# Patient Record
Sex: Male | Born: 1970 | Race: White | Hispanic: No | Smoking: Current every day smoker
Health system: Southern US, Community
[De-identification: ages and names within clinical notes are randomized; demographics above are authoritative.]

## PROBLEM LIST (undated history)

## (undated) DIAGNOSIS — J45909 Unspecified asthma, uncomplicated: Secondary | ICD-10-CM

## (undated) DIAGNOSIS — R7303 Prediabetes: Secondary | ICD-10-CM

## (undated) HISTORY — DX: Prediabetes: R73.03

## (undated) HISTORY — PX: NO PAST SURGERIES: SHX2092

---

## 2015-02-27 ENCOUNTER — Emergency Department (HOSPITAL_BASED_OUTPATIENT_CLINIC_OR_DEPARTMENT_OTHER)
Admission: EM | Admit: 2015-02-27 | Discharge: 2015-02-28 | Disposition: A | Discharge status: Home / Self Care | Payer: Self-pay | Attending: Emergency Medicine | Admitting: Emergency Medicine

## 2015-02-27 ENCOUNTER — Emergency Department (HOSPITAL_BASED_OUTPATIENT_CLINIC_OR_DEPARTMENT_OTHER): Payer: Self-pay

## 2015-02-27 ENCOUNTER — Encounter (HOSPITAL_BASED_OUTPATIENT_CLINIC_OR_DEPARTMENT_OTHER): Payer: Self-pay | Admitting: *Deleted

## 2015-02-27 DIAGNOSIS — Y998 Other external cause status: Secondary | ICD-10-CM | POA: Insufficient documentation

## 2015-02-27 DIAGNOSIS — Y9389 Activity, other specified: Secondary | ICD-10-CM | POA: Insufficient documentation

## 2015-02-27 DIAGNOSIS — W208XXA Other cause of strike by thrown, projected or falling object, initial encounter: Secondary | ICD-10-CM | POA: Insufficient documentation

## 2015-02-27 DIAGNOSIS — S298XXA Other specified injuries of thorax, initial encounter: Secondary | ICD-10-CM

## 2015-02-27 DIAGNOSIS — Y92009 Unspecified place in unspecified non-institutional (private) residence as the place of occurrence of the external cause: Secondary | ICD-10-CM | POA: Insufficient documentation

## 2015-02-27 DIAGNOSIS — F17210 Nicotine dependence, cigarettes, uncomplicated: Secondary | ICD-10-CM | POA: Insufficient documentation

## 2015-02-27 DIAGNOSIS — S29001A Unspecified injury of muscle and tendon of front wall of thorax, initial encounter: Secondary | ICD-10-CM | POA: Insufficient documentation

## 2015-02-27 NOTE — ED Notes (Signed)
Chest pain x 3 days. Worse when he takes a deep breath. He was working under a house 3 days ago and a board fell across his left upper chest.

## 2015-02-27 NOTE — ED Provider Notes (Signed)
CSN: [id number]     Arrival date & time 02/27/15  2215 History  By signing my name below, I, Soijett Blue, attest that this documentation has been prepared under the direction and in the presence of Paula Libra, MD. Electronically Signed: Soijett Blue, ED Scribe. 02/27/2015. 12:01 AM.   Chief Complaint  Patient presents with  . Chest Injury    The history is provided by the patient. No language interpreter was used.    HPI Comments: Creig Landin is a 44 y.o. male who presents to the Emergency Department complaining of chest injury occurring 3 days ago. He notes that he was working under a house when a floor joist fell on his right shoulder causing a pop sensation to his left sided chest. He notes that since then he has had left upper chest wall tenderness that is worsened with movement and deep breathing. Pain is moderate to severe at its worst. He has not tried any medications for the relief for his symptoms. He denies SOB but finds it difficult to take a deep breath due to pain.   History reviewed. No pertinent past medical history. History reviewed. No pertinent past surgical history. No family history on file. Social History  Substance Use Topics  . Smoking status: Current Every Day Smoker -- 1.50 packs/day    Types: Cigarettes  . Smokeless tobacco: None  . Alcohol Use: No    Review of Systems  A complete 10 system review of systems was obtained and all systems are negative except as noted in the HPI and PMH.    Allergies  Review of patient's allergies indicates no known allergies.  Home Medications   Prior to Admission medications   Medication Sig Start Date End Date Taking? Authorizing Provider  HYDROcodone-acetaminophen (NORCO/VICODIN) 5-325 MG tablet Take 1-2 tablets by mouth every 6 (six) hours as needed (for rib pain). 02/28/15   Xerxes Agrusa, MD   BP 146/94 mmHg  Pulse 73  Temp(Src) 98 F (36.7 C) (Oral)  Resp 20  Ht  (1.854 m)  Wt 240 lb (108.863 kg)   BMI 31.67 kg/m2  SpO2 100%   Physical Exam General: Well-developed, well-nourished male in no acute distress; appearance consistent with age of record HENT: normocephalic; atraumatic Eyes: pupils equal, round and reactive to light; extraocular muscles intact Neck: supple Heart: regular rate and rhythm Lungs: clear to auscultation bilaterally Chest: Left upper chest wall tenderness without deformity or crepitus, pain reproduced with passive range of motion of the left shoulder Abdomen: soft; nondistended; nontender; bowel sounds present Extremities: No deformity; full range of motion Neurologic: Awake, alert and oriented; motor function intact in all extremities and symmetric; no facial droop Skin: Warm and dry Psychiatric: Normal mood and affect    ED Course  Procedures (including critical care time) DIAGNOSTIC STUDIES: Oxygen Saturation is 100% on RA, nl by my interpretation.    COORDINATION OF CARE: 12:00 AM Discussed treatment plan with pt at bedside which includes CXR and EKG and pt agreed to plan.   EKG Interpretation   Date/Time:  Tuesday February 27 2015 22:38:33 EST Ventricular Rate:  66 PR Interval:  130 QRS Duration: 90 QT Interval:  364 QTC Calculation: 381 R Axis:   53 Text Interpretation:  Normal sinus rhythm Normal ECG No old tracing to  compare Confirmed by Ethelda Chick  MD, SAM [phone]) on 02/27/2015 11:20:55 PM      MDM  Nursing notes and vitals signs, including pulse oximetry, reviewed.  Summary of this visit's  results, reviewed by myself:  Imaging Studies: Dg Chest 2 View  02/27/2015  CLINICAL DATA:  44 year old male with left upper anterior chest pain EXAM: CHEST  2 VIEW COMPARISON:  None. FINDINGS: Two views of the chest demonstrate minimal bibasilar dependent and platelike atelectatic changes. No focal consolidation, pleural effusion, or pneumothorax. The mediastinum is within normal limits. The osseous structures appear unremarkable. IMPRESSION: No  active cardiopulmonary disease. Electronically Signed   By: Elgie CollardArash  Radparvar M.D.   On: 02/27/2015 22:56     Final diagnoses:  Blunt chest trauma, initial encounter   I personally performed the services described in this documentation, which was scribed in my presence. The recorded information has been reviewed and is accurate.   Paula LibraJohn Grizel Vesely, MD 02/28/15 [phone]

## 2015-02-27 NOTE — ED Notes (Signed)
Patient gone to Xray when I went to get EKG

## 2015-02-27 NOTE — ED Notes (Signed)
States he was under a house when a floor joist fell on his right shoulder causing his body weight to shift onto his left side.  When the injury occurred he felt a pop and then the discomfort. Denies N/V but reports it taking his breath away. Pt Alert and Oriented. Denies any radiation. Has not taken any OTC meds.

## 2015-02-28 MED ORDER — HYDROCODONE-ACETAMINOPHEN 5-325 MG PO TABS: 1.0000 | ORAL_TABLET | Freq: Four times a day (QID) | ORAL | Status: DC | PRN

## 2015-02-28 MED ORDER — HYDROCODONE-ACETAMINOPHEN 5-325 MG PO TABS
1.0000 | ORAL_TABLET | Freq: Once | ORAL | Status: AC
  Administered 2015-02-28: 1 via ORAL
  Filled 2015-02-28: qty 1

## 2015-02-28 NOTE — Discharge Instructions (Signed)
Blunt Chest Trauma °Blunt chest trauma is an injury caused by a blow to the chest. These chest injuries can be very painful. Blunt chest trauma often results in bruised or broken (fractured) ribs. Most cases of bruised and fractured ribs from blunt chest traumas get better after 1 to 3 weeks of rest and pain medicine. Often, the soft tissue in the chest wall is also injured, causing pain and bruising. Internal organs, such as the heart and lungs, may also be injured. Blunt chest trauma can lead to serious medical problems. This injury requires immediate medical care. °CAUSES  °· Motor vehicle collisions. °· Falls. °· Physical violence. °· Sports injuries. °SYMPTOMS  °· Chest pain. The pain may be worse when you move or breathe deeply. °· Shortness of breath. °· Lightheadedness. °· Bruising. °· Tenderness. °· Swelling. °DIAGNOSIS  °Your caregiver will do a physical exam. X-rays may be taken to look for fractures. However, minor rib fractures may not show up on X-rays until a few days after the injury. If a more serious injury is suspected, further imaging tests may be done. This may include ultrasounds, computed tomography (CT) scans, or magnetic resonance imaging (MRI). °TREATMENT  °Treatment depends on the severity of your injury. Your caregiver may prescribe pain medicines and deep breathing exercises. °HOME CARE INSTRUCTIONS °· Limit your activities until you can move around without much pain. °· Do not do any strenuous work until your injury is healed. °· Put ice on the injured area. °¨ Put ice in a plastic bag. °¨ Place a towel between your skin and the bag. °¨ Leave the ice on for 15-20 minutes, 03-04 times a day. °· You may wear a rib belt as directed by your caregiver to reduce pain. °· Practice deep breathing as directed by your caregiver to keep your lungs clear. °· Only take over-the-counter or prescription medicines for pain, fever, or discomfort as directed by your caregiver. °SEEK IMMEDIATE MEDICAL  CARE IF:  °· You have increasing pain or shortness of breath. °· You cough up blood. °· You have nausea, vomiting, or abdominal pain. °· You have a fever. °· You feel dizzy, weak, or you faint. °MAKE SURE YOU: °· Understand these instructions. °· Will watch your condition. °· Will get help right away if you are not doing well or get worse. °  °This information is not intended to replace advice given to you by your health care provider. Make sure you discuss any questions you have with your health care provider. °  °Document Released: 04/17/2004 Document Revised: 03/31/2014 Document Reviewed: 09/06/2014 °Elsevier Interactive Patient Education ©2016 Elsevier Inc[employer] ° °

## 2015-02-28 NOTE — ED Notes (Signed)
RT at bedside for [employer] instruction!!

## 2015-02-28 NOTE — ED Notes (Signed)
Family at bedside to drive pt home.

## 2015-03-02 ENCOUNTER — Emergency Department (HOSPITAL_BASED_OUTPATIENT_CLINIC_OR_DEPARTMENT_OTHER)
Admission: EM | Admit: 2015-03-02 | Discharge: 2015-03-02 | Disposition: A | Discharge status: Home / Self Care | Payer: Self-pay | Attending: Emergency Medicine | Admitting: Emergency Medicine

## 2015-03-02 ENCOUNTER — Emergency Department (HOSPITAL_BASED_OUTPATIENT_CLINIC_OR_DEPARTMENT_OTHER): Payer: Self-pay

## 2015-03-02 DIAGNOSIS — F17210 Nicotine dependence, cigarettes, uncomplicated: Secondary | ICD-10-CM | POA: Insufficient documentation

## 2015-03-02 DIAGNOSIS — R0789 Other chest pain: Secondary | ICD-10-CM | POA: Insufficient documentation

## 2015-03-02 DIAGNOSIS — R062 Wheezing: Secondary | ICD-10-CM | POA: Insufficient documentation

## 2015-03-02 DIAGNOSIS — R0602 Shortness of breath: Secondary | ICD-10-CM | POA: Insufficient documentation

## 2015-03-02 DIAGNOSIS — Z79899 Other long term (current) drug therapy: Secondary | ICD-10-CM | POA: Insufficient documentation

## 2015-03-02 MED ORDER — ALBUTEROL SULFATE HFA 108 (90 BASE) MCG/ACT IN AERS
2.0000 | INHALATION_SPRAY | RESPIRATORY_TRACT | Status: DC | PRN
Start: 2015-03-02 — End: 2015-03-02
  Administered 2015-03-02: 2 via RESPIRATORY_TRACT
  Filled 2015-03-02: qty 6.7

## 2015-03-02 MED ORDER — ALBUTEROL SULFATE HFA 108 (90 BASE) MCG/ACT IN AERS: 2.0000 | INHALATION_SPRAY | RESPIRATORY_TRACT | Status: DC | PRN

## 2015-03-02 MED ORDER — IBUPROFEN 600 MG PO TABS: 600.0000 mg | ORAL_TABLET | Freq: Four times a day (QID) | ORAL | Status: DC | PRN

## 2015-03-02 MED ORDER — KETOROLAC TROMETHAMINE 60 MG/2ML IM SOLN
60.0000 mg | Freq: Once | INTRAMUSCULAR | Status: AC
Start: 2015-03-02 — End: 2015-03-02
  Administered 2015-03-02: 60 mg via INTRAMUSCULAR
  Filled 2015-03-02: qty 2

## 2015-03-02 MED ORDER — PREDNISONE 50 MG PO TABS
60.0000 mg | ORAL_TABLET | Freq: Once | ORAL | Status: AC
  Administered 2015-03-02: 60 mg via ORAL
  Filled 2015-03-02: qty 1

## 2015-03-02 MED ORDER — OXYCODONE-ACETAMINOPHEN 5-325 MG PO TABS
1.0000 | ORAL_TABLET | Freq: Once | ORAL | Status: AC
  Administered 2015-03-02: 1 via ORAL
  Filled 2015-03-02: qty 1

## 2015-03-02 MED ORDER — PREDNISONE 20 MG PO TABS
60.0000 mg | ORAL_TABLET | Freq: Every day | ORAL | Status: DC
Start: 2015-03-02 — End: 2019-06-03

## 2015-03-02 MED ORDER — IPRATROPIUM-ALBUTEROL 0.5-2.5 (3) MG/3ML IN SOLN
3.0000 mL | Freq: Four times a day (QID) | RESPIRATORY_TRACT | Status: DC
  Administered 2015-03-02: 3 mL via RESPIRATORY_TRACT
  Filled 2015-03-02: qty 3

## 2015-03-02 NOTE — ED Provider Notes (Addendum)
CSN: [id number]     Arrival date & time 03/02/15  0351 History   First MD Initiated Contact with Patient 03/02/15 [phone]     Chief Complaint  Patient presents with  . Shortness of Breath  . Pleurisy     (Consider location/radiation/quality/duration/timing/severity/associated sxs/prior Treatment) HPI  This 44 year old male who presents with chest pain and shortness of breath. Patient was seen and evaluated 2 days ago for the same. He reported at that time that he had an injury on Monday night involving a joist landing on his chest. He had a normal chest x-ray. He reports he is taking Percocet and using his incentive spirometer. He continues to have sharp sternal chest pain. It woke him up from sleep tonight. Currently he rates his pain a 10 out of 10. He also reports shortness of breath. He continues to smoke. Denies any fevers or leg swelling.  No past medical history on file. No past surgical history on file. No family history on file. Social History  Substance Use Topics  . Smoking status: Current Every Day Smoker -- 1.50 packs/day    Types: Cigarettes  . Smokeless tobacco: Not on file  . Alcohol Use: No    Review of Systems  Constitutional: Negative.  Negative for fever.  Respiratory: Positive for shortness of breath. Negative for chest tightness.   Cardiovascular: Positive for chest pain. Negative for leg swelling.  Gastrointestinal: Negative.   Genitourinary: Negative.   Skin: Negative for color change and wound.  All other systems reviewed and are negative.     Allergies  Review of patient's allergies indicates no known allergies.  Home Medications   Prior to Admission medications   Medication Sig Start Date End Date Taking? Authorizing Provider  albuterol (PROVENTIL HFA;VENTOLIN HFA) 108 (90 BASE) MCG/ACT inhaler Inhale 2 puffs into the lungs every 4 (four) hours as needed for wheezing or shortness of breath. 03/02/15   Shon Batonourtney F Horton, MD   HYDROcodone-acetaminophen (NORCO/VICODIN) 5-325 MG tablet Take 1-2 tablets by mouth every 6 (six) hours as needed (for rib pain). 02/28/15   John Molpus, MD  ibuprofen (ADVIL,MOTRIN) 600 MG tablet Take 1 tablet (600 mg total) by mouth every 6 (six) hours as needed. 03/02/15   Shon Batonourtney F Horton, MD  predniSONE (DELTASONE) 20 MG tablet Take 3 tablets (60 mg total) by mouth daily with breakfast. 03/02/15   Shon Batonourtney F Horton, MD   BP 141/93 mmHg  Pulse 87  Temp(Src) 97.7 F (36.5 C) (Oral)  Resp 20  Ht 6\' 1"  (1.854 m)  Wt 240 lb (108.863 kg)  BMI 31.67 kg/m2  SpO2 90% Physical Exam  Constitutional: He is oriented to person, place, and time. No distress.  Smells of smoke, no acute distress  HENT:  Head: Normocephalic and atraumatic.  Cardiovascular: Normal rate, regular rhythm and normal heart sounds.   No murmur heard. Pulmonary/Chest: Effort normal. No respiratory distress.  Shallow respirations, normal effort and work of breathing, diffuse expiratory wheezing, tenderness palpation over the left chest wall musculature, no crepitus  Abdominal: Soft. There is no tenderness.  Musculoskeletal: He exhibits no edema.  Neurological: He is alert and oriented to person, place, and time.  Skin: Skin is warm and dry.  Psychiatric: He has a normal mood and affect.  Nursing note and vitals reviewed.   ED Course  Procedures (including critical care time) Labs Review Labs Reviewed - No data to display  Imaging Review Dg Chest 2 View  03/02/2015  CLINICAL DATA:  44 year old male  with shortness of breath and left chest pain. EXAM: CHEST  2 VIEW COMPARISON:  Chest radiograph dated 02/27/2015 FINDINGS: The heart size and mediastinal contours are within normal limits. Both lungs are clear. The visualized skeletal structures are unremarkable. IMPRESSION: No focal consolidation.  No interval change. Electronically Signed   By: Elgie Collard M.D.   On: 03/02/2015 04:39   I have personally reviewed and  evaluated these images and lab results as part of my medical decision-making.   EKG Interpretation None      MDM   Final diagnoses:  Chest wall pain  Wheezing    Patient presents with continued pain and worsening shortness of breath. He is a current smoker and wheezing diffusely. Sats are 92%. He has tenderness to palpation along the chest wall without crepitus. Repeat chest x-ray obtained and shows no evidence of pneumothorax or interval change. Patient was given a DuoNeb and prednisone. Patient was also given IM Toradol. Wheezing likely a result of current smoking and recent injury resulting in shallow breathing. Discussed with patient at length continuing incentive spirometry at home.  He will be given a short course of prednisone. He needs to quit smoking. He is not taking any anti-inflammatories at this time. Will add 600 mg ibuprofen every 6 hours to pain regimen at home.  After history, exam, and medical workup I feel the patient has been appropriately medically screened and is safe for discharge home. Pertinent diagnoses were discussed with the patient. Patient was given return precautions.     Shon Baton, MD 03/02/15 [phone]  On recheck after DuoNeb, patient with improved aeration. Ambulated and maintain pulse ox greater than 90%.  Shon Baton, MD 03/02/15 [phone]

## 2015-03-02 NOTE — ED Notes (Signed)
Pt reports waken from sleep d/t SOB abd increased chest pain [facility]

## 2015-03-02 NOTE — ED Notes (Signed)
Per physician's verbal order patient was ambulated via pulse ox. Patient's oxygen saturation maintained between 90 and 92% with heart rate of 92. Patient maintained steady gait without difficulty. Patient returned to room physician notified,

## 2015-03-02 NOTE — ED Notes (Signed)
Patient transported to X-ray 

## 2015-03-02 NOTE — Discharge Instructions (Signed)
Chest Wall Pain Chest wall pain is pain in or around the bones and muscles of your chest. Sometimes, an injury causes this pain. Sometimes, the cause may not be known. This pain may take several weeks or longer to get better. HOME CARE INSTRUCTIONS  Pay attention to any changes in your symptoms. Take these actions to help with your pain:   Rest as told by your health care provider.   Avoid activities that cause pain. These include any activities that use your chest muscles or your abdominal and side muscles to lift heavy items.   If directed, apply ice to the painful area:  Put ice in a plastic bag.  Place a towel between your skin and the bag.  Leave the ice on for 20 minutes, 2-3 times per day.  Take over-the-counter and prescription medicines only as told by your health care provider.  Do not use tobacco products, including cigarettes, chewing tobacco, and e-cigarettes. If you need help quitting, ask your health care provider.  Keep all follow-up visits as told by your health care provider. This is important. SEEK MEDICAL CARE IF:  You have a fever.  Your chest pain becomes worse.  You have new symptoms. SEEK IMMEDIATE MEDICAL CARE IF:  You have nausea or vomiting.  You feel sweaty or light-headed.  You have a cough with phlegm (sputum) or you cough up blood.  You develop shortness of breath.   This information is not intended to replace advice given to you by your health care provider. Make sure you discuss any questions you have with your health care provider.   Document Released: 03/10/2005 Document Revised: 11/29/2014 Document Reviewed: 06/05/2014 Elsevier Interactive Patient Education 2016 Elsevier Inc. Bronchospasm, Adult A bronchospasm is when the tubes that carry air in and out of your lungs (airways) spasm or tighten. During a bronchospasm it is hard to breathe. This is because the airways get smaller. A bronchospasm can be triggered by: Allergies. These  may be to animals, pollen, food, or mold. Infection. This is a common cause of bronchospasm. Exercise. Irritants. These include pollution, cigarette smoke, strong odors, aerosol sprays, and paint fumes. Weather changes. Stress. Being emotional. HOME CARE  Always have a plan for getting help. Know when to call your doctor and local emergency services (911 in the [place] Know where you can get emergency care. Only take medicines as told by your doctor. If you were prescribed an inhaler or nebulizer machine, ask your doctor how to use it correctly. Always use a spacer with your inhaler if you were given one. Stay calm during an attack. Try to relax and breathe more slowly. Control your home environment: Change your heating and air conditioning filter at least once a month. Limit your use of fireplaces and wood stoves. Do not  smoke. Do not  allow smoking in your home. Avoid perfumes and fragrances. Get rid of pests (such as roaches and mice) and their droppings. Throw away plants if you see mold on them. Keep your house clean and dust free. Replace carpet with wood, tile, or vinyl flooring. Carpet can trap dander and dust. Use allergy-proof pillows, mattress covers, and box spring covers. Wash bed sheets and blankets every week in hot water. Dry them in a dryer. Use blankets that are made of polyester or cotton. Wash hands frequently. GET HELP IF: You have muscle aches. You have chest pain. The thick spit you spit or cough up (sputum) changes from clear or white to yellow, green, gray,  or bloody. The thick spit you spit or cough up gets thicker. There are problems that may be related to the medicine you are given such as: A rash. Itching. Swelling. Trouble breathing. GET HELP RIGHT AWAY IF: You feel you cannot breathe or catch your breath. You cannot stop coughing. Your treatment is not helping you breathe better. You have very bad chest pain. MAKE SURE YOU:  Understand these  instructions. Will watch your condition. Will get help right away if you are not doing well or get worse.   This information is not intended to replace advice given to you by your health care provider. Make sure you discuss any questions you have with your health care provider.   Document Released: 01/05/2009 Document Revised: 03/31/2014 Document Reviewed: 08/31/2012 Elsevier Interactive Patient Education [employer]

## 2016-08-27 IMAGING — DX DG CHEST 2V
2 series · 2 of 2 positions shown · non-contrast
Comparison: Chest radiograph dated 02/27/2015

CLINICAL DATA: 43-year-old male with shortness of breath and left
chest pain.

EXAM:
CHEST  2 VIEW

[chest pa]
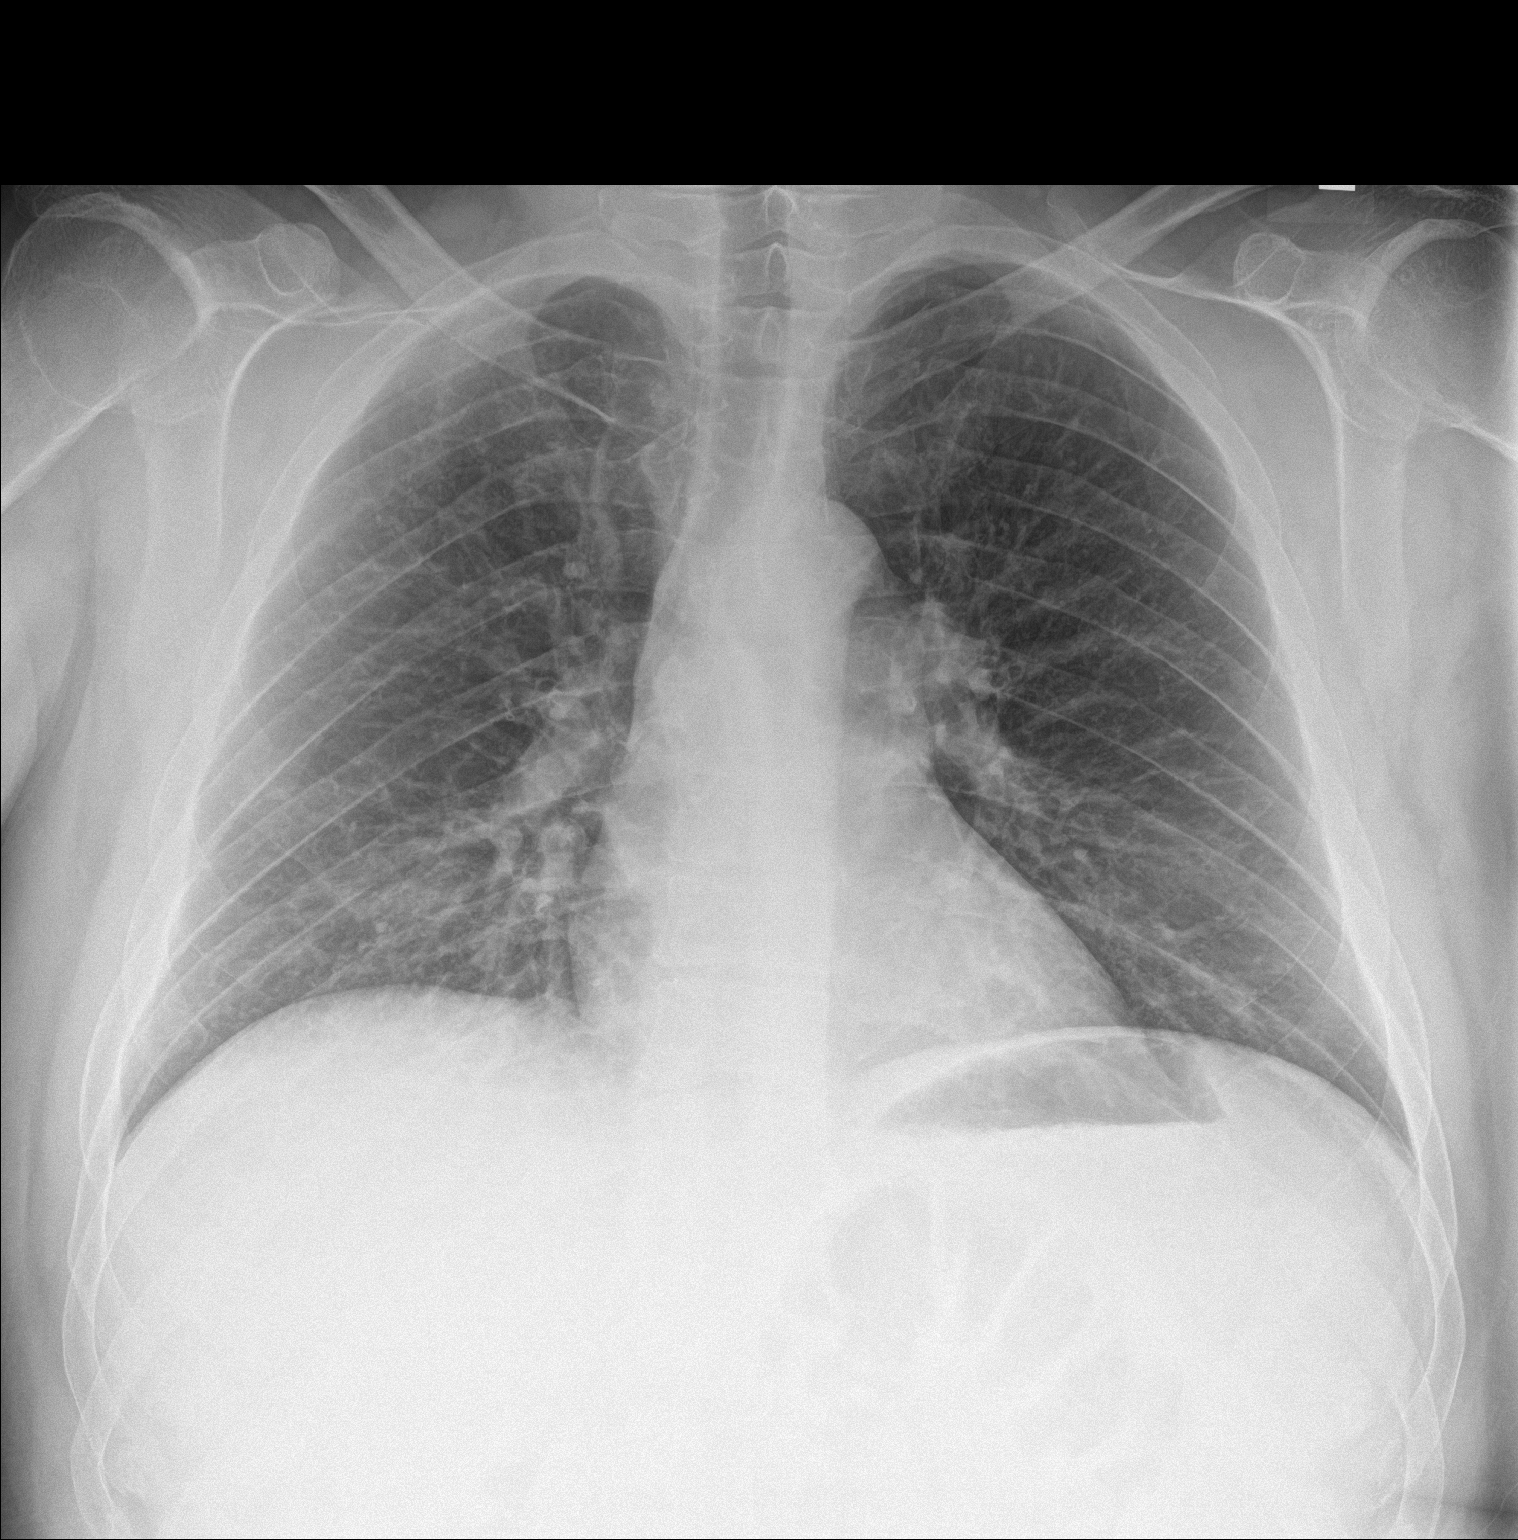

[chest lat]
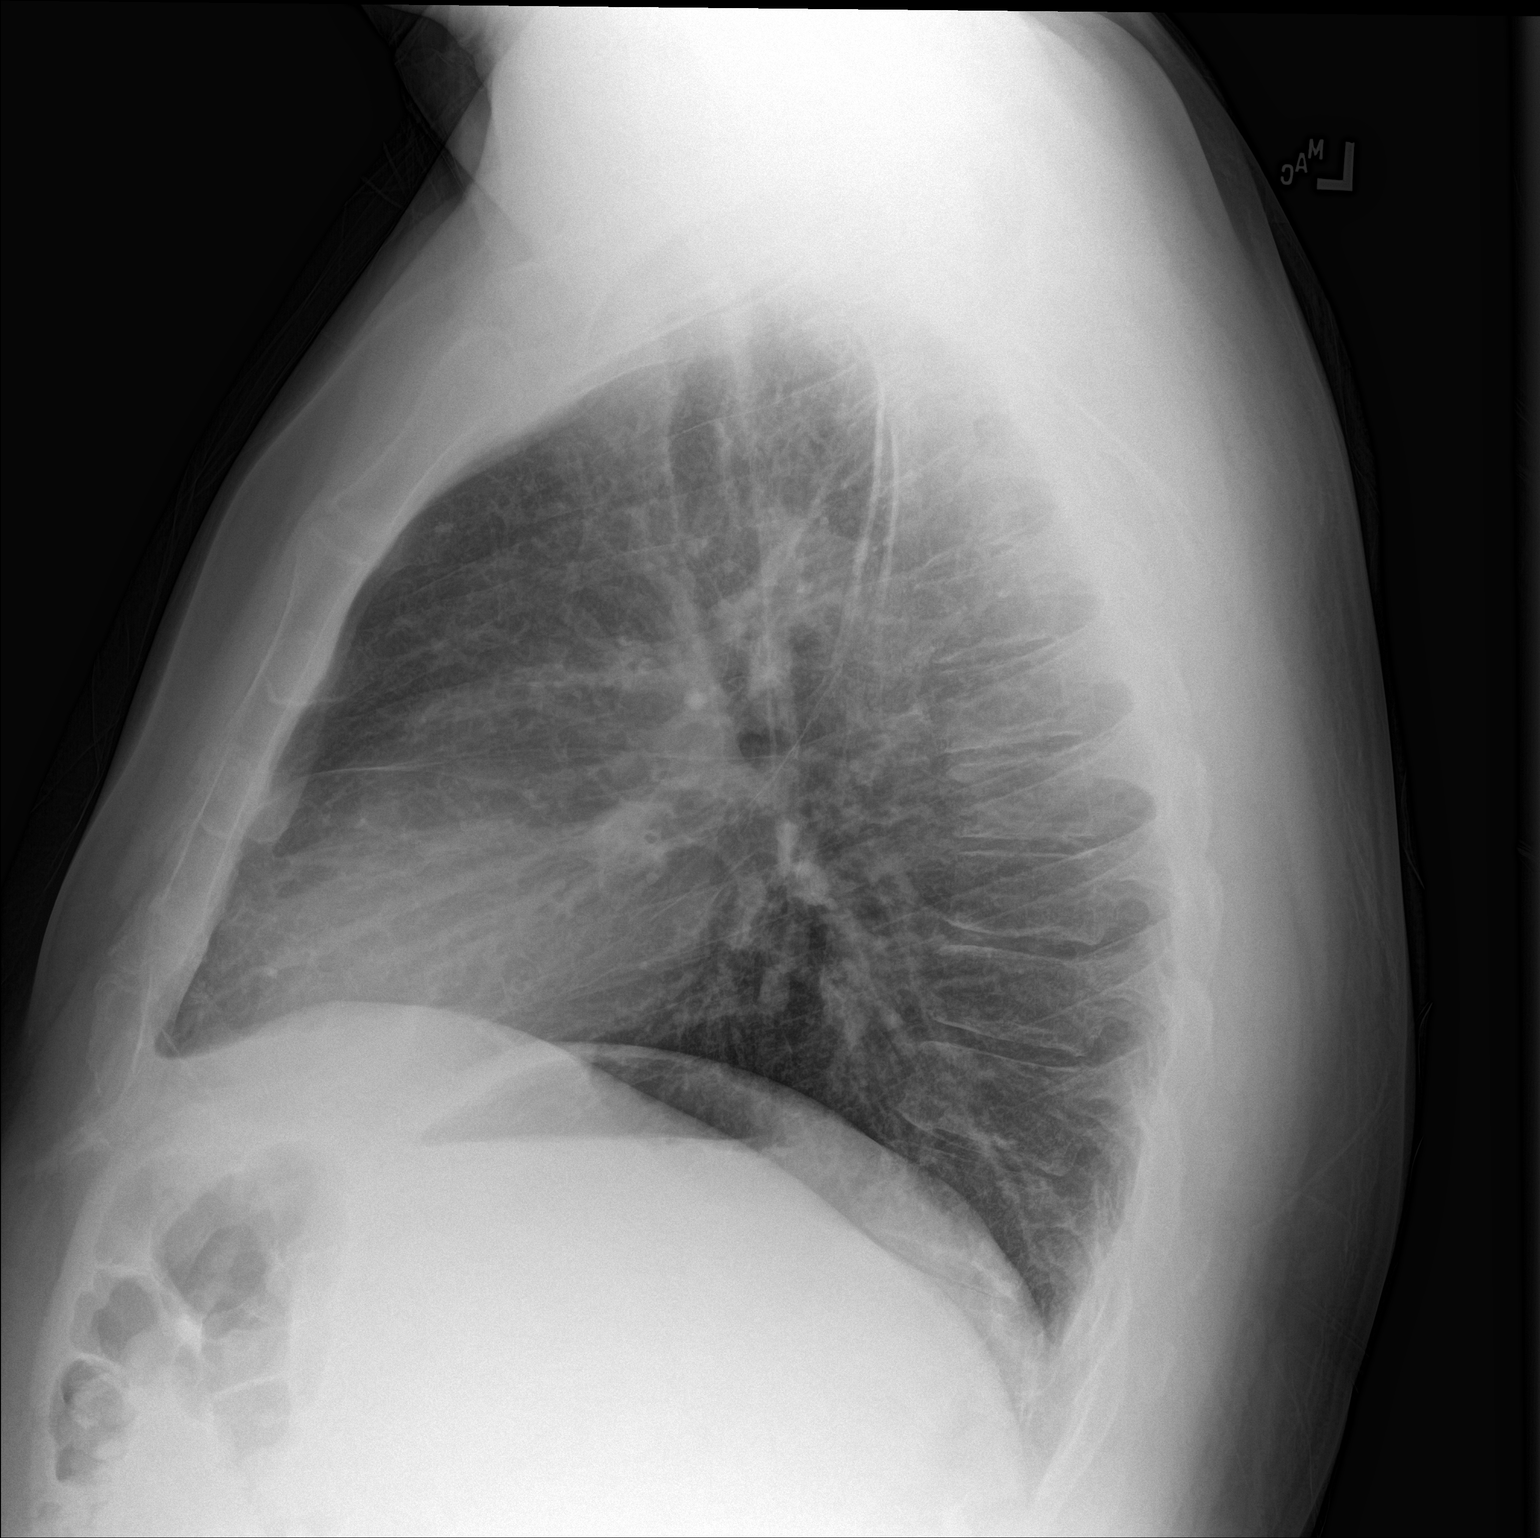

[2 of 2 positions shown; findings below may reference images not displayed]

FINDINGS: The heart size and mediastinal contours are within normal limits.
Both lungs are clear. The visualized skeletal structures are
unremarkable.
IMPRESSION: No focal consolidation.  No interval change.

## 2016-12-21 ENCOUNTER — Emergency Department (HOSPITAL_BASED_OUTPATIENT_CLINIC_OR_DEPARTMENT_OTHER)
Admission: EM | Admit: 2016-12-21 | Discharge: 2016-12-21 | Disposition: A | Discharge status: Home / Self Care | Payer: Self-pay | Attending: Emergency Medicine | Admitting: Emergency Medicine

## 2016-12-21 ENCOUNTER — Encounter (HOSPITAL_BASED_OUTPATIENT_CLINIC_OR_DEPARTMENT_OTHER): Payer: Self-pay | Admitting: Emergency Medicine

## 2016-12-21 ENCOUNTER — Emergency Department (HOSPITAL_BASED_OUTPATIENT_CLINIC_OR_DEPARTMENT_OTHER): Payer: Self-pay

## 2016-12-21 DIAGNOSIS — J069 Acute upper respiratory infection, unspecified: Secondary | ICD-10-CM | POA: Insufficient documentation

## 2016-12-21 DIAGNOSIS — J45909 Unspecified asthma, uncomplicated: Secondary | ICD-10-CM | POA: Insufficient documentation

## 2016-12-21 DIAGNOSIS — J988 Other specified respiratory disorders: Secondary | ICD-10-CM

## 2016-12-21 DIAGNOSIS — F17210 Nicotine dependence, cigarettes, uncomplicated: Secondary | ICD-10-CM | POA: Insufficient documentation

## 2016-12-21 HISTORY — DX: Unspecified asthma, uncomplicated: J45.909

## 2016-12-21 LAB — BASIC METABOLIC PANEL
ANION GAP: 11 (ref 5–15)
BUN: 18 mg/dL (ref 6–20)
CHLORIDE: 102 mmol/L (ref 101–111)
CO2: 23 mmol/L (ref 22–32)
Calcium: 9.6 mg/dL (ref 8.9–10.3)
Creatinine, Ser: 0.76 mg/dL (ref 0.61–1.24)
GFR calc non Af Amer: >60 mL/min (ref >60)
Glucose, Bld: 127 mg/dL — ABNORMAL HIGH (ref 65–99)
POTASSIUM: 3.5 mmol/L (ref 3.5–5.1)
Sodium: 136 mmol/L (ref 135–145)

## 2016-12-21 LAB — CBC WITH DIFFERENTIAL/PLATELET
BASOS ABS: 0 10*3/uL (ref 0.0–0.1)
BASOS PCT: 0 %
Eosinophils Absolute: 0.7 10*3/uL (ref 0.0–0.7)
Eosinophils Relative: 10 %
HEMATOCRIT: 44.4 % (ref 39.0–52.0)
HEMOGLOBIN: 15.9 g/dL (ref 13.0–17.0)
LYMPHS PCT: 21 %
Lymphs Abs: 1.4 10*3/uL (ref 0.7–4.0)
MCH: 30.9 pg (ref 26.0–34.0)
MCHC: 35.8 g/dL (ref 30.0–36.0)
MCV: 86.4 fL (ref 78.0–100.0)
Monocytes Absolute: 0.6 10*3/uL (ref 0.1–1.0)
Monocytes Relative: 9 %
NEUTROS PCT: 60 %
Neutro Abs: 4.1 10*3/uL (ref 1.7–7.7)
Platelets: 147 10*3/uL — ABNORMAL LOW (ref 150–400)
RBC: 5.14 MIL/uL (ref 4.22–5.81)
RDW: 12.3 % (ref 11.5–15.5)
WBC: 6.8 10*3/uL (ref 4.0–10.5)

## 2016-12-21 MED ORDER — ALBUTEROL SULFATE HFA 108 (90 BASE) MCG/ACT IN AERS
2.0000 | INHALATION_SPRAY | Freq: Once | RESPIRATORY_TRACT | Status: AC
  Administered 2016-12-21: 2 via RESPIRATORY_TRACT
  Filled 2016-12-21: qty 6.7

## 2016-12-21 MED ORDER — METHYLPREDNISOLONE 4 MG PO TBPK: ORAL_TABLET | ORAL | 0 refills | Status: DC

## 2016-12-21 MED ORDER — ALBUTEROL (5 MG/ML) CONTINUOUS INHALATION SOLN
10.0000 mg/h | INHALATION_SOLUTION | RESPIRATORY_TRACT | Status: AC
  Administered 2016-12-21: 10 mg/h via RESPIRATORY_TRACT
  Filled 2016-12-21: qty 20

## 2016-12-21 MED ORDER — IBUPROFEN 800 MG PO TABS
800.0000 mg | ORAL_TABLET | Freq: Once | ORAL | Status: AC
  Administered 2016-12-21: 800 mg via ORAL
  Filled 2016-12-21: qty 1

## 2016-12-21 MED ORDER — IPRATROPIUM-ALBUTEROL 0.5-2.5 (3) MG/3ML IN SOLN
RESPIRATORY_TRACT | Status: AC
Start: 2016-12-21 — End: 2016-12-21
  Administered 2016-12-21: 3 mL
  Filled 2016-12-21: qty 3

## 2016-12-21 MED ORDER — ALBUTEROL SULFATE (2.5 MG/3ML) 0.083% IN NEBU
INHALATION_SOLUTION | RESPIRATORY_TRACT | Status: AC
Start: 2016-12-21 — End: 2016-12-21
  Administered 2016-12-21: 2.5 mg
  Filled 2016-12-21: qty 3

## 2016-12-21 MED ORDER — MAGNESIUM SULFATE 2 GM/50ML IV SOLN
2.0000 g | Freq: Once | INTRAVENOUS | Status: AC
  Administered 2016-12-21: 2 g via INTRAVENOUS
  Filled 2016-12-21: qty 50

## 2016-12-21 MED ORDER — IPRATROPIUM BROMIDE 0.02 % IN SOLN
0.5000 mg | Freq: Once | RESPIRATORY_TRACT | Status: AC
  Administered 2016-12-21: 0.5 mg via RESPIRATORY_TRACT
  Filled 2016-12-21: qty 2.5

## 2016-12-21 MED ORDER — SODIUM CHLORIDE 0.9 % IV BOLUS (SEPSIS)
500.0000 mL | Freq: Once | INTRAVENOUS | Status: AC
  Administered 2016-12-21: 500 mL via INTRAVENOUS

## 2016-12-21 MED ORDER — GUAIFENESIN 100 MG/5ML PO SOLN
15.0000 mL | Freq: Once | ORAL | Status: AC
  Administered 2016-12-21: 300 mg via ORAL
  Filled 2016-12-21: qty 10

## 2016-12-21 MED ORDER — AEROCHAMBER PLUS W/MASK MISC
1.0000 | Freq: Once | Status: AC
  Administered 2016-12-21: 1
  Filled 2016-12-21: qty 1

## 2016-12-21 MED ORDER — DEXAMETHASONE SODIUM PHOSPHATE 10 MG/ML IJ SOLN
10.0000 mg | Freq: Once | INTRAMUSCULAR | Status: AC
  Administered 2016-12-21: 10 mg via INTRAVENOUS
  Filled 2016-12-21: qty 1

## 2016-12-21 NOTE — ED Provider Notes (Signed)
[facility]   CSN: [id number] Arrival date & time: 12/21/16  1134     History   Chief Complaint Chief Complaint  Patient presents with  . Generalized Body Aches    HPI Raymond Gonzalez is a 46 y.o. male daily smoker who presents emergency Department with chief complaint of URI and difficulty breathing.  He has a history of wheezing however this is his worst exacerbation. He has never been hospitalized or intubated. The patient states that he has had fatigue, malaise, body aches, nasal congestion, coughing, wheezing, gagging, nausea and one episode of vomiting yesterday for the past 3 days. He is not using any medications to treat his symptoms. He denies fever. This morning his wheezing and tightness became extremely bad. He came to the emergency department for treatment.  HPI  Past Medical History:  Diagnosis Date  . Asthma     There are no active problems to display for this patient.   History reviewed. No pertinent surgical history.     Home Medications    Prior to Admission medications   Medication Sig Start Date End Date Taking? Authorizing Provider  albuterol (PROVENTIL HFA;VENTOLIN HFA) 108 (90 BASE) MCG/ACT inhaler Inhale 2 puffs into the lungs every 4 (four) hours as needed for wheezing or shortness of breath. 03/02/15   Horton, Mayer Masker, MD  HYDROcodone-acetaminophen (NORCO/VICODIN) 5-325 MG tablet Take 1-2 tablets by mouth every 6 (six) hours as needed (for rib pain). 02/28/15   Molpus, John, MD  ibuprofen (ADVIL,MOTRIN) 600 MG tablet Take 1 tablet (600 mg total) by mouth every 6 (six) hours as needed. 03/02/15   Horton, Mayer Masker, MD  predniSONE (DELTASONE) 20 MG tablet Take 3 tablets (60 mg total) by mouth daily with breakfast. 03/02/15   Horton, Mayer Masker, MD    Family History History reviewed. No pertinent family history.  Social History Social History  Substance Use Topics  . Smoking status: Current Every Day Smoker   Packs/day: 1.50    Types: Cigarettes  . Smokeless tobacco: Never Used  . Alcohol use No     Allergies   Patient has no known allergies.   Review of Systems Review of Systems Ten systems reviewed and are negative for acute change, except as noted in the HPI.     Physical Exam Updated Vital Signs BP (!) 161/100 (BP Location: Left Arm)   Pulse 96   Temp 98.1 F (36.7 C) (Oral)   Resp (!) 26   Ht  (1.854 m)   Wt 108.9 kg (240 lb)   SpO2 96%   BMI 31.66 kg/m   Physical Exam  Constitutional: He appears well-developed and well-nourished. No distress.  HENT:  Head: Normocephalic and atraumatic.  Eyes: Pupils are equal, round, and reactive to light. Conjunctivae and EOM are normal. No scleral icterus.  Neck: Normal range of motion. Neck supple.  Cardiovascular: Normal rate, regular rhythm and normal heart sounds.   Pulmonary/Chest: No respiratory distress. He has wheezes.  Diffuse inspiratory and expiratory wheezes, poor air movement Speaking in full sentences after one treatment at triage for his wheezing.  Abdominal: Soft. There is no tenderness.  Musculoskeletal: He exhibits no edema.  Neurological: He is alert.  Skin: Skin is warm and dry. He is not diaphoretic.  Psychiatric: His behavior is normal.  Nursing note and vitals reviewed.    ED Treatments / Results  Labs (all labs ordered are listed, but only abnormal results are displayed) Labs Reviewed  CBC WITH  DIFFERENTIAL/PLATELET - Abnormal; Notable for the following:       Result Value   Platelets 147 (*)    All other components within normal limits  BASIC METABOLIC PANEL - Abnormal; Notable for the following:    Glucose, Bld 127 (*)    All other components within normal limits    EKG  EKG Interpretation None       Radiology Dg Chest 2 View  Result Date: 12/21/2016 CLINICAL DATA:  Shortness of breath and cough. EXAM: CHEST  2 VIEW COMPARISON:  03/02/2015 FINDINGS: The cardiomediastinal  silhouette is within normal limits. The lungs are well inflated and clear. There is no evidence of pleural effusion or pneumothorax. No acute osseous abnormality is identified. IMPRESSION: No active cardiopulmonary disease. Electronically Signed   By: Sebastian Ache M.D.   On: 12/21/2016 12:52    Procedures Procedures (including critical care time)  Medications Ordered in ED Medications  albuterol (PROVENTIL) (2.5 MG/3ML) 0.083% nebulizer solution (2.5 mg  Given 12/21/16 1154)  ipratropium-albuterol (DUONEB) 0.5-2.5 (3) MG/3ML nebulizer solution (3 mLs  Given 12/21/16 1154)     Initial Impression / Assessment and Plan / ED Course  I have reviewed the triage vital signs and the nursing notes.  Pertinent labs & imaging results that were available during my care of the patient were reviewed by me and considered in my medical decision making (see chart for details).     Patient with URI and wheezing. He is improved after treatment here in the emergency department. He continues to have some wheezing but feels greatly improved. Discussed return precautions with the patient. Discussed need to quit smoking.  The patient was counseled on the dangers of tobacco use, and was advised to quit.  Reviewed strategies to maximize success, including removing cigarettes and smoking materials from environment, stress management, substitution of other forms of reinforcement and support of family/friends.  Final Clinical Impressions(s) / ED Diagnoses   Final diagnoses:  Wheezing-associated respiratory infection (WARI)  Upper respiratory tract infection, unspecified type    New Prescriptions New Prescriptions   No medications on file     Arthor Captain, PA-C 12/21/16 1705    Arby Barrette, MD 12/26/16 [phone]

## 2016-12-21 NOTE — ED Notes (Signed)
CAT finshed at this time

## 2016-12-21 NOTE — ED Triage Notes (Signed)
Patient states that he has SOB and coughing - reports that he has tightness in his chest and has audible wheezing. Reports that his cough and wheezing is worse when laying down. Reports that some nausea

## 2016-12-21 NOTE — ED Notes (Signed)
Patient transported to X-ray 

## 2016-12-21 NOTE — Discharge Instructions (Signed)
Get help right away if: °Your wheezing and coughing get worse, even after you take your prescribed medicines. °It gets even harder to breathe. °You develop severe chest pain. °

## 2017-02-01 ENCOUNTER — Encounter (HOSPITAL_BASED_OUTPATIENT_CLINIC_OR_DEPARTMENT_OTHER): Payer: Self-pay | Admitting: Emergency Medicine

## 2017-02-01 ENCOUNTER — Emergency Department (HOSPITAL_BASED_OUTPATIENT_CLINIC_OR_DEPARTMENT_OTHER)
Admission: EM | Admit: 2017-02-01 | Discharge: 2017-02-02 | Disposition: A | Discharge status: Home / Self Care | Payer: Self-pay | Attending: Emergency Medicine | Admitting: Emergency Medicine

## 2017-02-01 ENCOUNTER — Other Ambulatory Visit: Payer: Self-pay

## 2017-02-01 DIAGNOSIS — J40 Bronchitis, not specified as acute or chronic: Secondary | ICD-10-CM | POA: Insufficient documentation

## 2017-02-01 DIAGNOSIS — F17210 Nicotine dependence, cigarettes, uncomplicated: Secondary | ICD-10-CM | POA: Insufficient documentation

## 2017-02-01 DIAGNOSIS — R0602 Shortness of breath: Secondary | ICD-10-CM | POA: Insufficient documentation

## 2017-02-01 DIAGNOSIS — Z79899 Other long term (current) drug therapy: Secondary | ICD-10-CM | POA: Insufficient documentation

## 2017-02-01 DIAGNOSIS — H6122 Impacted cerumen, left ear: Secondary | ICD-10-CM | POA: Insufficient documentation

## 2017-02-01 MED ORDER — DEXAMETHASONE SODIUM PHOSPHATE 10 MG/ML IJ SOLN
10.0000 mg | Freq: Once | INTRAMUSCULAR | Status: AC
  Administered 2017-02-01: 10 mg via INTRAMUSCULAR
  Filled 2017-02-01: qty 1

## 2017-02-01 MED ORDER — IPRATROPIUM-ALBUTEROL 0.5-2.5 (3) MG/3ML IN SOLN
3.0000 mL | Freq: Four times a day (QID) | RESPIRATORY_TRACT | Status: DC
  Administered 2017-02-01: 3 mL via RESPIRATORY_TRACT
  Filled 2017-02-01: qty 3

## 2017-02-01 MED ORDER — ALBUTEROL SULFATE HFA 108 (90 BASE) MCG/ACT IN AERS
2.0000 | INHALATION_SPRAY | Freq: Once | RESPIRATORY_TRACT | Status: AC
Start: 2017-02-01 — End: 2017-02-02
  Administered 2017-02-02: 2 via RESPIRATORY_TRACT
  Filled 2017-02-01: qty 6.7

## 2017-02-01 MED ORDER — DOCUSATE SODIUM 50 MG/5ML PO LIQD
50.0000 mg | Freq: Once | ORAL | Status: AC
  Administered 2017-02-01: 50 mg via OTIC
  Filled 2017-02-01: qty 10

## 2017-02-01 NOTE — ED Triage Notes (Addendum)
PT presents with shortness of breath for a few months, states he is out of his inhalers. PT also c/o of left ear pain for 2 weeks.

## 2017-02-02 MED ORDER — ALBUTEROL SULFATE HFA 108 (90 BASE) MCG/ACT IN AERS: 2.0000 | INHALATION_SPRAY | RESPIRATORY_TRACT | 0 refills | Status: DC | PRN

## 2017-02-02 NOTE — Progress Notes (Signed)
Albuterol MDI with spacer given with instruction for use.  Patient with good understanding of use with proper demonstration of technique.

## 2017-02-02 NOTE — ED Notes (Signed)
Significant amt of wax irrigated from both ears. Pt states he feels much better and can hear out of his ear now.

## 2017-02-02 NOTE — ED Provider Notes (Signed)
[facility] EMERGENCY DEPARTMENT Provider Note   CSN: [id number] Arrival date & time: 02/01/17  2206     History   Chief Complaint Chief Complaint  Patient presents with  . Shortness of Breath    HPI Raymond Gonzalez is a 46 y.o. male.  HPI  This is a 46 year old male who presents with shortness of breath and left hearing loss.  Patient reports progressively worsening shortness of breath since running out of his inhaler.  He states that at night specifically he feels very congested and wakes up coughing.  Denies any recent fevers but does have a history of fever over the last month.  He was seen and evaluated for similar symptoms and treated for bronchitis.  He states he had market resolution of the symptoms but they worsen when he ran out of his inhaler.  He also reports persistent hearing loss in the left ear.  No significant pain.  Denies any chest pain, abdominal pain, nausea, vomiting.  No known sick contacts.  Patient was provided a DuoNeb prior to my evaluation and states he feels much better.  Past Medical History:  Diagnosis Date  . Asthma     There are no active problems to display for this patient.   History reviewed. No pertinent surgical history.     Home Medications    Prior to Admission medications   Medication Sig Start Date End Date Taking? Authorizing Provider  albuterol (PROVENTIL HFA;VENTOLIN HFA) 108 (90 BASE) MCG/ACT inhaler Inhale 2 puffs into the lungs every 4 (four) hours as needed for wheezing or shortness of breath. 03/02/15   Takeia Ciaravino, Mayer Maskerourtney F, MD  albuterol (PROVENTIL HFA;VENTOLIN HFA) 108 (90 Base) MCG/ACT inhaler Inhale 2 puffs every 4 (four) hours as needed into the lungs for wheezing or shortness of breath. 02/02/17   Mckynzi Cammon, Mayer Maskerourtney F, MD  HYDROcodone-acetaminophen (NORCO/VICODIN) 5-325 MG tablet Take 1-2 tablets by mouth every 6 (six) hours as needed (for rib pain). 02/28/15   Molpus, John, MD  ibuprofen (ADVIL,MOTRIN) 600 MG  tablet Take 1 tablet (600 mg total) by mouth every 6 (six) hours as needed. 03/02/15   Shon BatonHorton, Rolena Knutson F, MD  methylPREDNISolone (MEDROL DOSEPAK) 4 MG TBPK tablet Use as directed 12/21/16   Arthor CaptainHarris, Abigail, PA-C  predniSONE (DELTASONE) 20 MG tablet Take 3 tablets (60 mg total) by mouth daily with breakfast. 03/02/15   Marigny Borre, Mayer Maskerourtney F, MD    Family History No family history on file.  Social History Social History   Tobacco Use  . Smoking status: Current Every Day Smoker    Packs/day: 1.50    Types: Cigarettes  . Smokeless tobacco: Never Used  Substance Use Topics  . Alcohol use: No  . Drug use: No     Allergies   Patient has no known allergies.   Review of Systems Review of Systems  Constitutional: Negative for fever.  HENT: Positive for hearing loss.   Respiratory: Positive for cough, shortness of breath and wheezing.   Cardiovascular: Negative for chest pain and leg swelling.  Gastrointestinal: Negative for abdominal pain and vomiting.  Genitourinary: Negative for dysuria.  All other systems reviewed and are negative.    Physical Exam Updated Vital Signs BP (!) 146/103 (BP Location: Left Arm)   Pulse 81   Temp 98 F (36.7 C) (Oral)   Resp 20   Ht 6\' 1"  (1.854 m)   Wt 108.9 kg (240 lb)   SpO2 97%   BMI 31.66 kg/m   Physical Exam  Constitutional: He is oriented to person, place, and time. He appears well-developed and well-nourished. No distress.  HENT:  Head: Normocephalic and atraumatic.  Left cerumen impaction obscuring tympanic membrane, right cerumen with some visualization of TM  Eyes: Pupils are equal, round, and reactive to light.  Neck: Neck supple.  Cardiovascular: Normal rate, regular rhythm and normal heart sounds.  No murmur heard. Pulmonary/Chest: Effort normal and breath sounds normal. No respiratory distress.  Good air movement, very faint expiratory wheeze occasionally  Abdominal: Soft. Bowel sounds are normal. There is no tenderness.  There is no rebound.  Musculoskeletal: He exhibits no edema.  Lymphadenopathy:    He has no cervical adenopathy.  Neurological: He is alert and oriented to person, place, and time.  Skin: Skin is warm and dry.  Psychiatric: He has a normal mood and affect.  Nursing note and vitals reviewed.    ED Treatments / Results  Labs (all labs ordered are listed, but only abnormal results are displayed) Labs Reviewed - No data to display  EKG  EKG Interpretation None       Radiology No results found.  Procedures .Ear Cerumen Removal Date/Time: 02/02/2017 12:28 AM Performed by: Shon BatonHorton, Johniya Durfee F, MD Authorized by: Shon BatonHorton, Paxtyn Wisdom F, MD   Consent:    Consent obtained:  Verbal   Consent given by:  Patient   Risks discussed:  Bleeding, pain and TM perforation Procedure details:    Location:  L ear and R ear   Procedure type: irrigation   Post-procedure details:    Inspection:  TM intact   Hearing quality:  Improved   Patient tolerance of procedure:  Tolerated well, no immediate complications   (including critical care time)  Medications Ordered in ED Medications  ipratropium-albuterol (DUONEB) 0.5-2.5 (3) MG/3ML nebulizer solution 3 mL (3 mLs Nebulization Given 02/01/17 2250)  dexamethasone (DECADRON) injection 10 mg (10 mg Intramuscular Given 02/01/17 2351)  albuterol (PROVENTIL HFA;VENTOLIN HFA) 108 (90 Base) MCG/ACT inhaler 2 puff (2 puffs Inhalation Given 02/02/17 0010)  docusate (COLACE) 50 MG/5ML liquid 50 mg (50 mg Left EAR Given 02/01/17 2332)     Initial Impression / Assessment and Plan / ED Course  I have reviewed the triage vital signs and the nursing notes.  Pertinent labs & imaging results that were available during my care of the patient were reviewed by me and considered in my medical decision making (see chart for details).     Patient presents with shortness of breath and cough.  Worse at night.  Reports symptoms worsened since being out of his  inhaler.  He is otherwise nontoxic appearing.  Feels better after a DuoNeb.  He only has a very faint wheeze occasionally on my exam.  He does have bilateral cerumen impaction.  Patient was given IM Decadron for presumed bronchitis.  No indication for imaging at this time.  Doubt pneumonia.  Ears were cleaned and patient had resolution of his hearing.  After history, exam, and medical workup I feel the patient has been appropriately medically screened and is safe for discharge home. Pertinent diagnoses were discussed with the patient. Patient was given return precautions.   Final Clinical Impressions(s) / ED Diagnoses   Final diagnoses:  SOB (shortness of breath)  Bronchitis  Impacted cerumen of left ear    ED Discharge Orders        Ordered    albuterol (PROVENTIL HFA;VENTOLIN HFA) 108 (90 Base) MCG/ACT inhaler  Every 4 hours PRN     02/02/17 0024  Shon Baton, MD 02/02/17 [phone]

## 2017-02-21 ENCOUNTER — Telehealth (HOSPITAL_BASED_OUTPATIENT_CLINIC_OR_DEPARTMENT_OTHER): Payer: Self-pay | Admitting: Emergency Medicine

## 2018-03-04 ENCOUNTER — Emergency Department (HOSPITAL_BASED_OUTPATIENT_CLINIC_OR_DEPARTMENT_OTHER)
Admission: EM | Admit: 2018-03-04 | Discharge: 2018-03-04 | Disposition: A | Discharge status: Home / Self Care | Payer: Self-pay | Attending: Emergency Medicine | Admitting: Emergency Medicine

## 2018-03-04 ENCOUNTER — Encounter (HOSPITAL_BASED_OUTPATIENT_CLINIC_OR_DEPARTMENT_OTHER): Payer: Self-pay | Admitting: Emergency Medicine

## 2018-03-04 ENCOUNTER — Other Ambulatory Visit: Payer: Self-pay

## 2018-03-04 ENCOUNTER — Emergency Department (HOSPITAL_BASED_OUTPATIENT_CLINIC_OR_DEPARTMENT_OTHER): Payer: Self-pay

## 2018-03-04 DIAGNOSIS — F17210 Nicotine dependence, cigarettes, uncomplicated: Secondary | ICD-10-CM | POA: Insufficient documentation

## 2018-03-04 DIAGNOSIS — J45909 Unspecified asthma, uncomplicated: Secondary | ICD-10-CM | POA: Insufficient documentation

## 2018-03-04 DIAGNOSIS — N200 Calculus of kidney: Secondary | ICD-10-CM | POA: Insufficient documentation

## 2018-03-04 DIAGNOSIS — B353 Tinea pedis: Secondary | ICD-10-CM | POA: Insufficient documentation

## 2018-03-04 LAB — URINALYSIS, ROUTINE W REFLEX MICROSCOPIC
BILIRUBIN URINE: NEGATIVE
Glucose, UA: NEGATIVE mg/dL
Ketones, ur: NEGATIVE mg/dL
Leukocytes, UA: NEGATIVE
Nitrite: NEGATIVE
Protein, ur: NEGATIVE mg/dL
Specific Gravity, Urine: 1.025 (ref 1.005–1.030)
pH: 6 (ref 5.0–8.0)

## 2018-03-04 LAB — URINALYSIS, MICROSCOPIC (REFLEX): WBC UA: NONE SEEN WBC/hpf (ref 0–5)

## 2018-03-04 MED ORDER — KETOROLAC TROMETHAMINE 15 MG/ML IJ SOLN: 15.0000 mg | Freq: Once | INTRAMUSCULAR | Status: DC

## 2018-03-04 MED ORDER — KETOCONAZOLE 2 % EX CREA: 1.0000 "application " | TOPICAL_CREAM | Freq: Two times a day (BID) | CUTANEOUS | 0 refills | Status: DC

## 2018-03-04 MED ORDER — SODIUM CHLORIDE 0.9 % IV BOLUS: 500.0000 mL | Freq: Once | INTRAVENOUS | Status: DC

## 2018-03-04 NOTE — ED Triage Notes (Signed)
L flank pain with dysuria since yesterday.

## 2018-03-04 NOTE — ED Notes (Signed)
Pt seen leaving the department. 

## 2018-03-04 NOTE — ED Notes (Signed)
Pt enrolled in aromatherapy pain trial 

## 2018-03-04 NOTE — ED Notes (Signed)
Pt refusing IV 

## 2018-03-04 NOTE — ED Notes (Signed)
Not enough urine available to obtain culture

## 2018-03-04 NOTE — ED Provider Notes (Signed)
[facility] EMERGENCY DEPARTMENT Provider Note   CSN: [id number] Arrival date & time: 03/04/18  1025     History   Chief Complaint Chief Complaint  Patient presents with  . Flank Pain    HPI Raymond Gonzalez is a 47 y.o. male presenting for evaluation of flank pain and urinary symptoms.  Patient states he has had left flank pain since yesterday.  He has associated dysuria and urinary frequency with minimal output.  Pain was 10 out of 10, took to 600 mg tablets of ibuprofen which improved his pain to an 8 out of 10.  Patient states his urine is dark yellow, but denies hematuria.  He reports objective fevers and chills.  He has associated URI symptoms including cough, nasal congestion, and sore throat.  He denies chest pain or shortness of breath.  He denies anterior abdominal pain.  He has no medical problems, takes no medications daily.  He reports tobacco use, no alcohol or drug use.  He does have a history of kidney stones, is been about 2 years since his last kidney stone.  He states this does feel similar.  Additional history obtained from chart review.  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  Patient states he is on the tail end of weaning himself off opioids, does not want any narcotics today.   HPI  Past Medical History:  Diagnosis Date  . Asthma     There are no active problems to display for this patient.   History reviewed. No pertinent surgical history.      Home Medications    Prior to Admission medications   Medication Sig Start Date End Date Taking? Authorizing Provider  albuterol (PROVENTIL HFA;VENTOLIN HFA) 108 (90 BASE) MCG/ACT inhaler Inhale 2 puffs into the lungs every 4 (four) hours as needed for wheezing or shortness of breath. 03/02/15   Horton, Mayer Masker, MD  albuterol (PROVENTIL HFA;VENTOLIN HFA) 108 (90 Base) MCG/ACT inhaler Inhale 2 puffs every 4 (four) hours as needed into the lungs for wheezing or shortness of breath.  02/02/17   Horton, Mayer Masker, MD  HYDROcodone-acetaminophen (NORCO/VICODIN) 5-325 MG tablet Take 1-2 tablets by mouth every 6 (six) hours as needed (for rib pain). 02/28/15   Molpus, John, MD  ibuprofen (ADVIL,MOTRIN) 600 MG tablet Take 1 tablet (600 mg total) by mouth every 6 (six) hours as needed. 03/02/15   Shon Baton, MD  methylPREDNISolone (MEDROL DOSEPAK) 4 MG TBPK tablet Use as directed 12/21/16   Arthor Captain, PA-C  predniSONE (DELTASONE) 20 MG tablet Take 3 tablets (60 mg total) by mouth daily with breakfast. 03/02/15   Horton, Mayer Masker, MD    Family History No family history on file.  Social History Social History   Tobacco Use  . Smoking status: Current Every Day Smoker    Packs/day: 1.50    Types: Cigarettes  . Smokeless tobacco: Never Used  Substance Use Topics  . Alcohol use: No  . Drug use: No     Allergies   Patient has no known allergies.   Review of Systems Review of Systems  Constitutional: Positive for chills (subjective) and fever (subjective).  HENT: Positive for congestion.   Respiratory: Positive for cough.   Genitourinary: Positive for dysuria, flank pain and frequency.  All other systems reviewed and are negative.    Physical Exam Updated Vital Signs BP (!) 157/106 (BP Location: Right Arm)   Pulse 69   Temp 98.3 F (36.8 C) (Oral)  Resp 18   Ht 6\' 1"  (1.854 m)   Wt 110.2 kg   SpO2 98%   BMI 32.06 kg/m   Physical Exam Vitals signs and nursing note reviewed.  Constitutional:      General: He is not in acute distress.    Appearance: He is well-developed.     Comments: Appears uncomfortable, but nontoxic  HENT:     Head: Normocephalic and atraumatic.  Eyes:     Conjunctiva/sclera: Conjunctivae normal.     Pupils: Pupils are equal, round, and reactive to light.  Neck:     Musculoskeletal: Normal range of motion and neck supple.  Cardiovascular:     Rate and Rhythm: Normal rate and regular rhythm.  Pulmonary:      Effort: Pulmonary effort is normal. No respiratory distress.     Breath sounds: Normal breath sounds. No wheezing.  Abdominal:     General: Bowel sounds are normal. There is no distension.     Palpations: Abdomen is soft. There is no mass.     Tenderness: There is no abdominal tenderness. There is left CVA tenderness. There is no guarding or rebound.     Comments: No ttp of anterior abd. Soft without rigidity, guarding, or distention. +L sided CVA tenderness.  Musculoskeletal: Normal range of motion.     Right lower leg: Edema present.     Left lower leg: Edema present.     Comments: Mild bilateral 1+ pitting edema  Skin:    General: Skin is warm and dry.     Capillary Refill: Capillary refill takes less than 2 seconds.     Findings: Rash present.     Comments: Circular well-described scaly rash on bilateral feel. Edges are raised. No drainage or surrounding erythema.   Neurological:     Mental Status: He is alert and oriented to person, place, and time.      ED Treatments / Results  Labs (all labs ordered are listed, but only abnormal results are displayed) Labs Reviewed  URINALYSIS, ROUTINE W REFLEX MICROSCOPIC - Abnormal; Notable for the following components:      Result Value   Hgb urine dipstick MODERATE (*)    All other components within normal limits  URINALYSIS, MICROSCOPIC (REFLEX) - Abnormal; Notable for the following components:   Bacteria, UA FEW (*)    All other components within normal limits  URINE CULTURE    EKG None  Radiology Ct Renal Stone Study  Result Date: 03/04/2018 CLINICAL DATA:  Left flank pain and hematuria EXAM: CT ABDOMEN AND PELVIS WITHOUT CONTRAST TECHNIQUE: Multidetector CT imaging of the abdomen and pelvis was performed following the standard protocol without oral or IV contrast. COMPARISON:  None. FINDINGS: Lower chest: Lung bases are clear. Hepatobiliary: There is hepatic steatosis. No focal liver lesions are appreciable on this  noncontrast enhanced study. Gallbladder wall is not appreciably thickened. There is no biliary duct dilatation. Pancreas: There is no pancreatic mass or inflammatory focus. Spleen: Spleen measures 15.1 x 14.9 x 7.0 cm with a measured splenic volume of 747 cubic cm. No focal splenic lesions are evident. Adrenals/Urinary Tract: Adrenals bilaterally appear normal. There is no evident renal mass on either side. There is moderate hydronephrosis on the left. There is no hydronephrosis on the right. There is no intrarenal calculus on either side. There is a 3 mm calculus in the distal left ureter near the left ureterovesical junction. No other ureteral calculi are evident. The urinary bladder is nearly collapsed. Urinary bladder wall  thickness is upper normal for near complete collapse. Stomach/Bowel: There is no appreciable bowel wall or mesenteric thickening. There is no evident bowel obstruction. There is no free air or portal venous air. Vascular/Lymphatic: There is no abdominal aortic aneurysm. There is slight aortic atherosclerosis. Major mesenteric arterial vessels appear patent on this noncontrast enhanced study. There is no evident adenopathy in the abdomen or pelvis. Reproductive: There are prostatic calculi. Prostate and seminal vesicles are normal in size and contour. There is no evident pelvic mass. Other: Appendix appears normal. There is no abscess or ascites in the abdomen or pelvis. There is fat in the left inguinal ring. Musculoskeletal: There are foci of degenerative change in the lower thoracic region. There are no blastic or lytic bone lesions. No intramuscular lesions are evident. IMPRESSION: 1. 3 mm calculus distal left ureter near the ureterovesical junction on the left. Moderate hydronephrosis on the left. 2.  Splenomegaly.  No focal splenic lesions evident. 3.  Hepatic steatosis. 4. No bowel obstruction. No abscess in the abdomen or pelvis. Appendix appears normal. 5.  Prostatic calculi noted. 6.   Mild aortic atherosclerosis. 7.  Moderate fat in left inguinal ring. Aortic Atherosclerosis (ICD10-I70.0). Electronically Signed   By: Bretta BangWilliam  Woodruff III M.D.   On: 03/04/2018 12:04    Procedures Procedures (including critical care time)  Medications Ordered in ED Medications - No data to display   Initial Impression / Assessment and Plan / ED Course  I have reviewed the triage vital signs and the nursing notes.  Pertinent labs & imaging results that were available during my care of the patient were reviewed by me and considered in my medical decision making (see chart for details).     Pt presenting for evaluation of L flank pain. Physical exam reassuring, he is afebrile and not tachycardic. Appears nontoxic.  Does have left CVA tenderness, concern for UTI versus Pyelo versus kidney stone versus MSK versus GI cause, favoring kidney stone considering his history.  Patient does not want anything for pain at this time.  Does not want an IV, is refusing blood draw.  As it has been several years since his last kidney stone, will obtain CT renal for further evaluation. Additionally, patient with rash on his feet consistent with tinea.  Will treat with ketoconazole cream, as he has been using Lotrimin without improvement.  CT renal shows 3 mm stone on the left side.  Mild to moderate hydro-on the left side.  Urine with blood, no signs of infection.  Patient left the department without informing staff and prior to being informed of his results.   Final Clinical Impressions(s) / ED Diagnoses   Final diagnoses:  Tinea pedis of both feet  Kidney stone on left side    ED Discharge Orders         Ordered    ketoconazole (NIZORAL) 2 % cream  2 times daily,   Status:  Discontinued     03/04/18 1149           Anthone Prieur, PA-C 03/04/18 1501    Vanetta MuldersZackowski, Scott, MD 03/05/18 [phone]

## 2018-03-05 LAB — URINE CULTURE: Culture: NO GROWTH

## 2018-06-18 IMAGING — CR DG CHEST 2V
2 series · 2 of 2 positions shown · non-contrast
Comparison: 03/02/2015

CLINICAL DATA: Shortness of breath and cough.

EXAM:
CHEST  2 VIEW

[w chest pa]
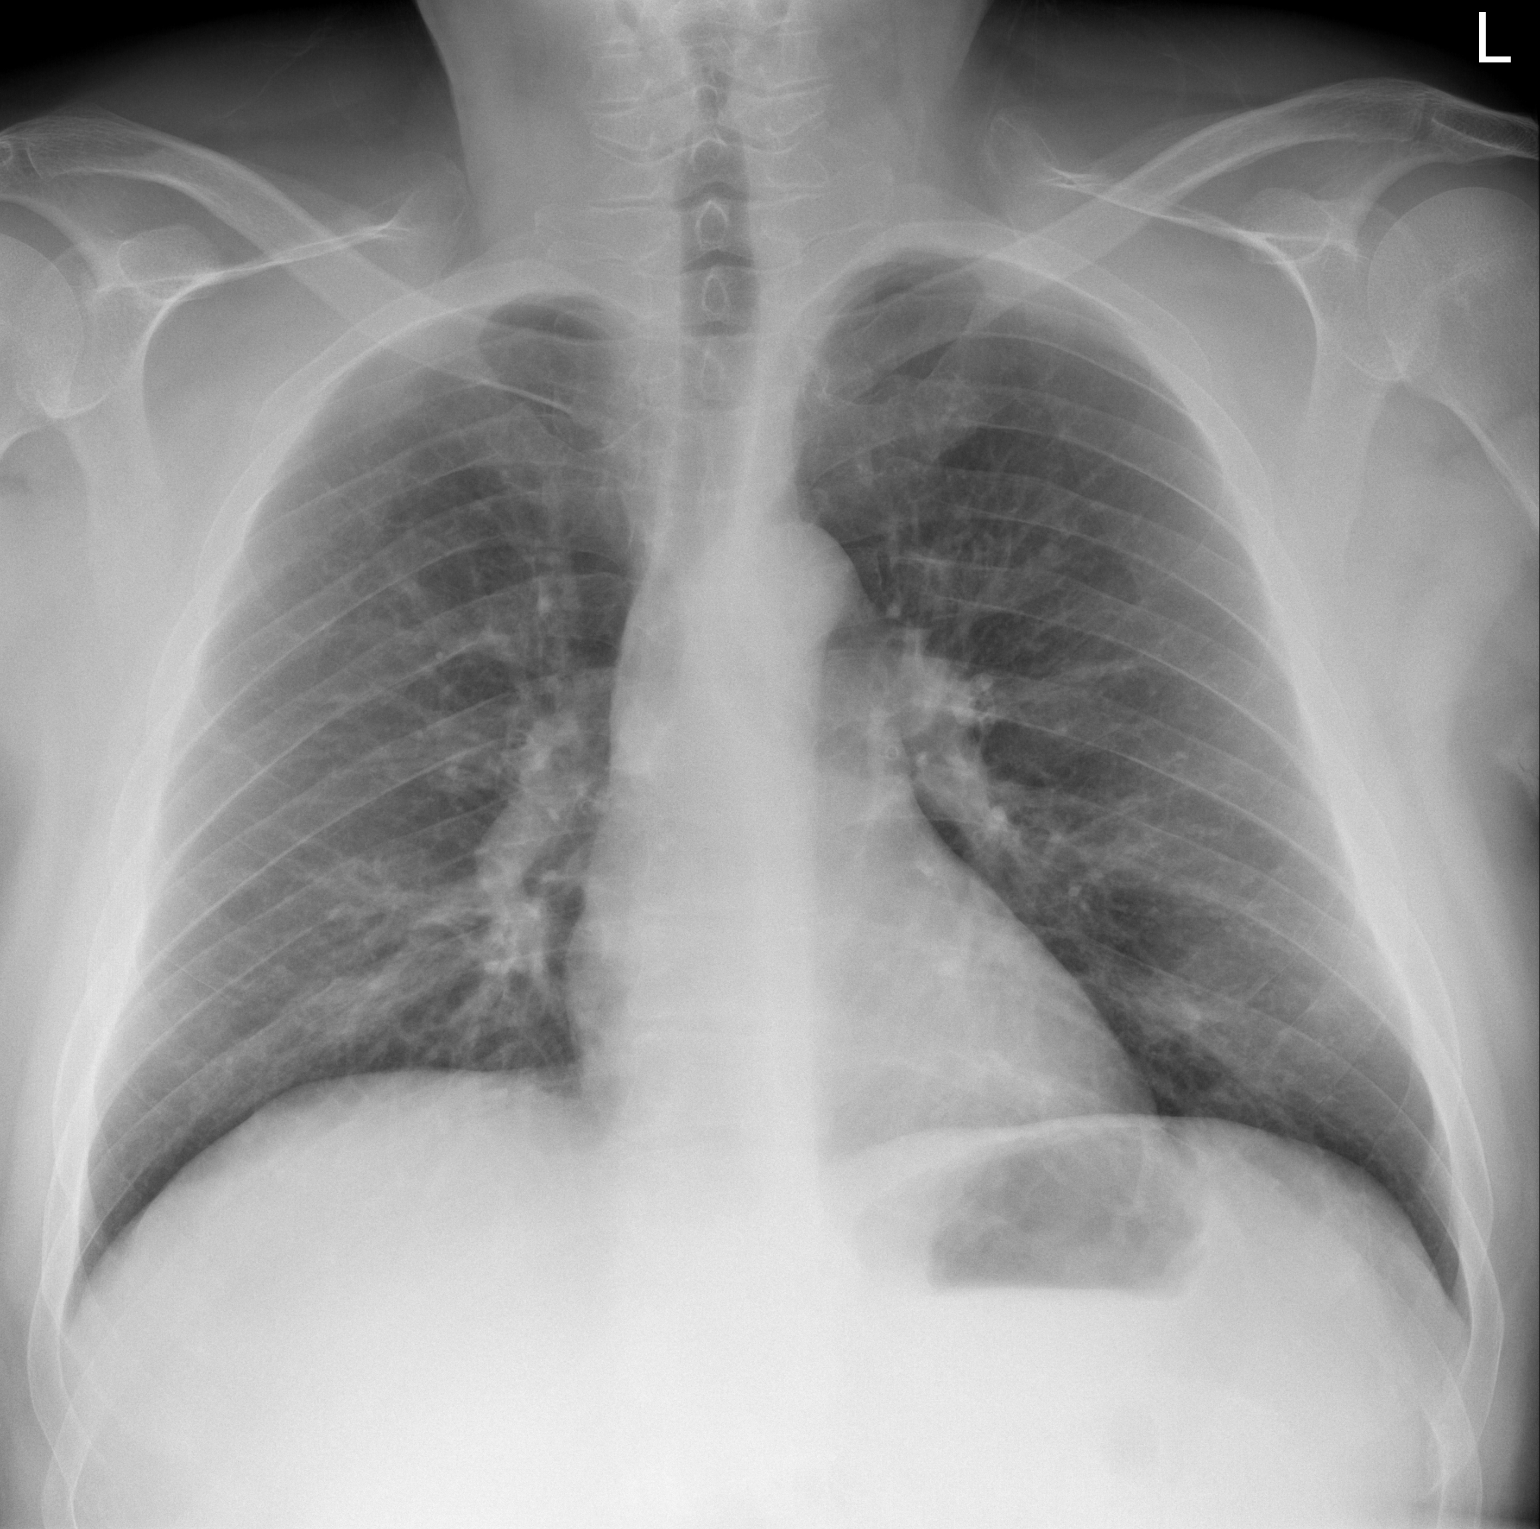

[w chest lat]
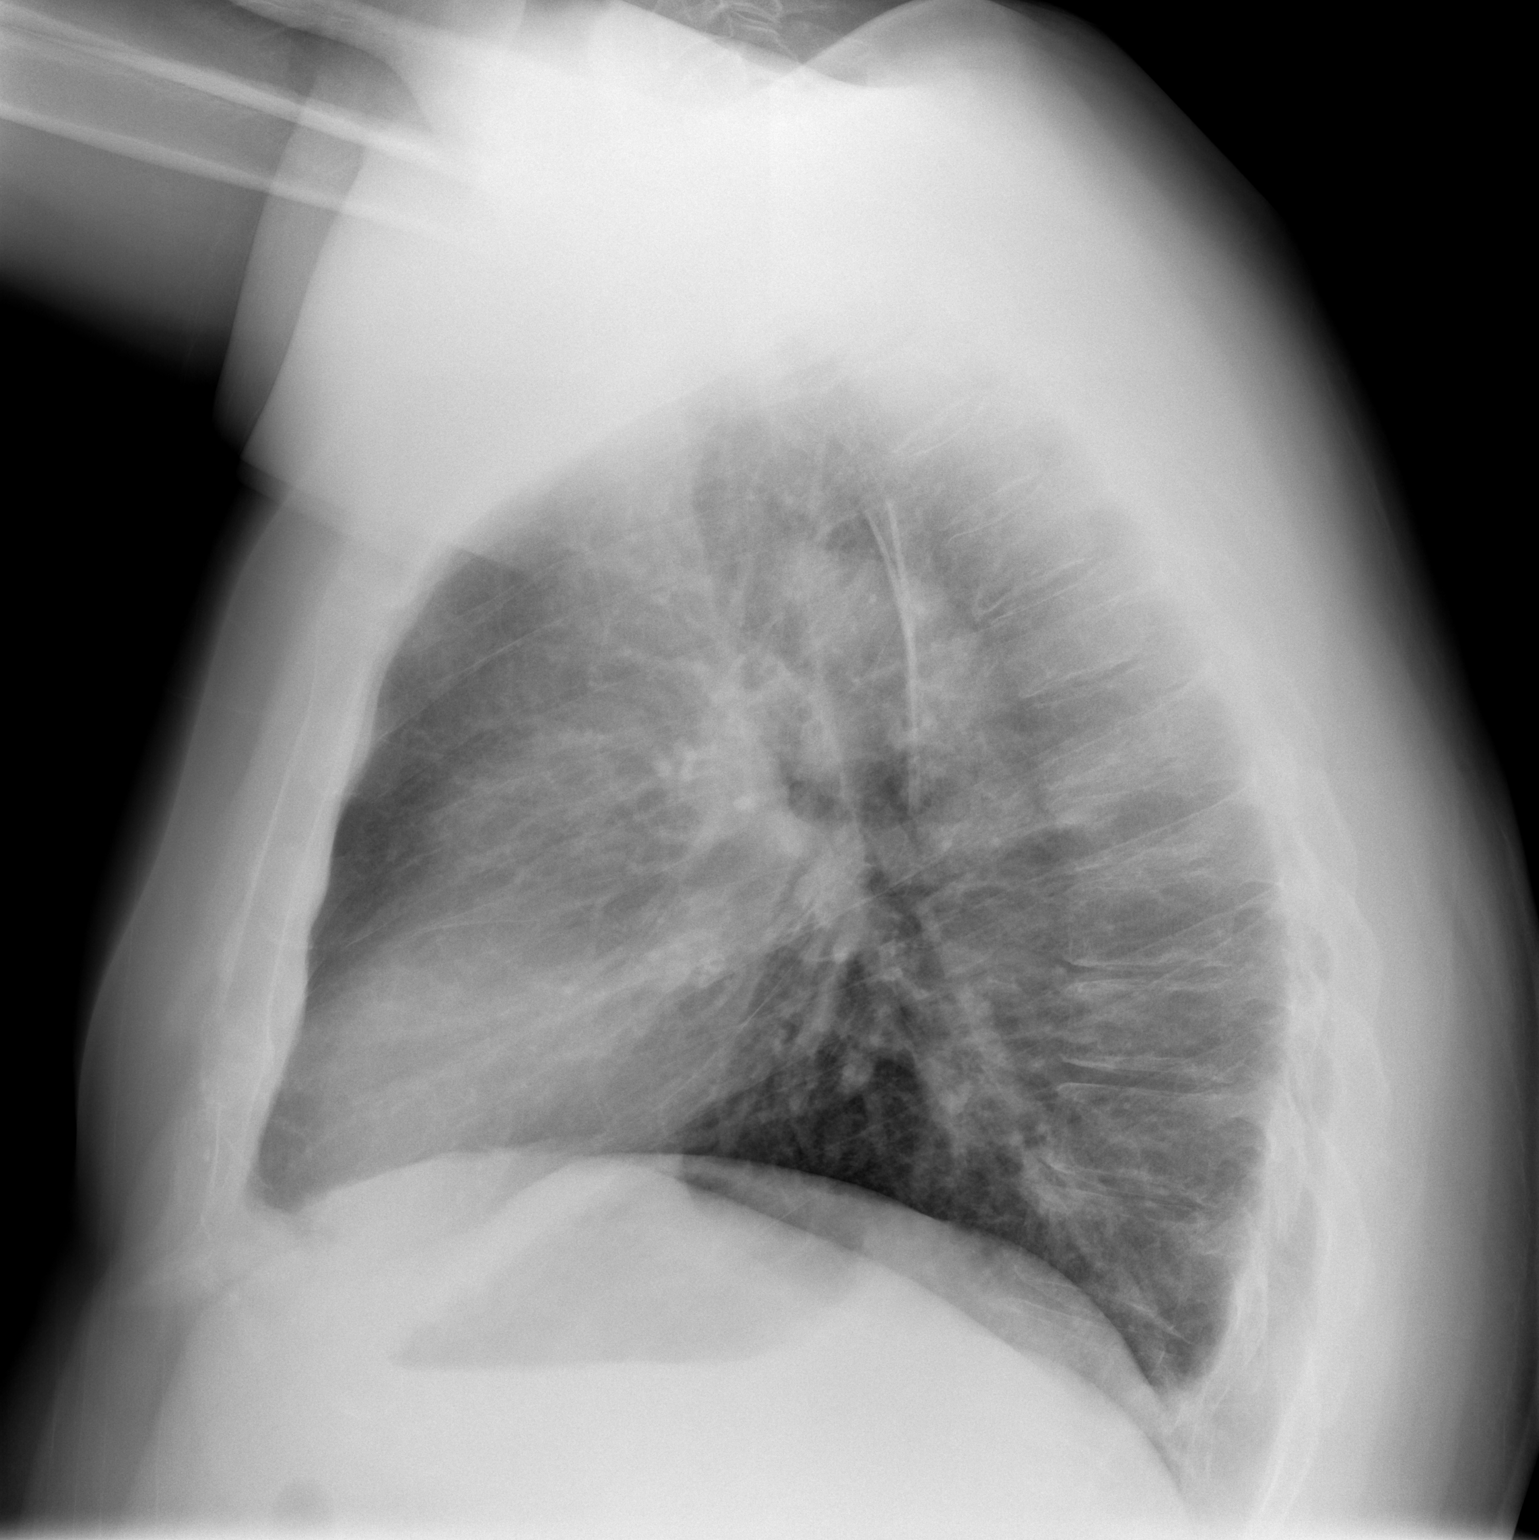

[2 of 2 positions shown; findings below may reference images not displayed]

FINDINGS: The cardiomediastinal silhouette is within normal limits. The lungs
are well inflated and clear. There is no evidence of pleural
effusion or pneumothorax. No acute osseous abnormality is
identified.
IMPRESSION: No active cardiopulmonary disease.

## 2019-06-02 ENCOUNTER — Other Ambulatory Visit: Payer: Self-pay

## 2019-06-02 DIAGNOSIS — J45909 Unspecified asthma, uncomplicated: Secondary | ICD-10-CM | POA: Insufficient documentation

## 2019-06-02 DIAGNOSIS — I83028 Varicose veins of left lower extremity with ulcer other part of lower leg: Secondary | ICD-10-CM | POA: Insufficient documentation

## 2019-06-02 DIAGNOSIS — L97921 Non-pressure chronic ulcer of unspecified part of left lower leg limited to breakdown of skin: Secondary | ICD-10-CM | POA: Insufficient documentation

## 2019-06-02 DIAGNOSIS — I872 Venous insufficiency (chronic) (peripheral): Secondary | ICD-10-CM | POA: Insufficient documentation

## 2019-06-02 DIAGNOSIS — F17210 Nicotine dependence, cigarettes, uncomplicated: Secondary | ICD-10-CM | POA: Insufficient documentation

## 2019-06-03 ENCOUNTER — Emergency Department (HOSPITAL_BASED_OUTPATIENT_CLINIC_OR_DEPARTMENT_OTHER)
Admission: EM | Admit: 2019-06-03 | Discharge: 2019-06-03 | Disposition: A | Discharge status: Home / Self Care | Payer: Self-pay | Attending: Emergency Medicine | Admitting: Emergency Medicine

## 2019-06-03 ENCOUNTER — Encounter (HOSPITAL_BASED_OUTPATIENT_CLINIC_OR_DEPARTMENT_OTHER): Payer: Self-pay | Admitting: Emergency Medicine

## 2019-06-03 DIAGNOSIS — I872 Venous insufficiency (chronic) (peripheral): Secondary | ICD-10-CM

## 2019-06-03 DIAGNOSIS — L97929 Non-pressure chronic ulcer of unspecified part of left lower leg with unspecified severity: Secondary | ICD-10-CM

## 2019-06-03 DIAGNOSIS — I83029 Varicose veins of left lower extremity with ulcer of unspecified site: Secondary | ICD-10-CM

## 2019-06-03 LAB — CBG MONITORING, ED: Glucose-Capillary: 140 mg/dL — ABNORMAL HIGH (ref 70–99)

## 2019-06-03 MED ORDER — MUPIROCIN CALCIUM 2 % EX CREA
TOPICAL_CREAM | Freq: Three times a day (TID) | CUTANEOUS | Status: DC
  Filled 2019-06-03: qty 15

## 2019-06-03 MED ORDER — ALBUTEROL SULFATE HFA 108 (90 BASE) MCG/ACT IN AERS: 2.0000 | INHALATION_SPRAY | RESPIRATORY_TRACT | 1 refills | Status: DC | PRN

## 2019-06-03 MED ORDER — MUPIROCIN 2 % EX OINT
TOPICAL_OINTMENT | CUTANEOUS | Status: AC
  Administered 2019-06-03: 1
  Filled 2019-06-03: qty 22

## 2019-06-03 NOTE — ED Provider Notes (Signed)
[facility] Provider Note: Raymond Dell, MD, FACEP  CSN: [id number] MRN: [id number] ARRIVAL: 06/02/19 at 2350 ROOM: [facility]09/[facility]09   CHIEF COMPLAINT  Wound Check   HISTORY OF PRESENT ILLNESS  06/03/19 12:10 AM Raymond Gonzalez is a 49 y.o. male who struck his left shin on a forklift about 2 months ago.  He has had a wound at that site persistently.  It has healed to various stages but he has reinjured it several times doing [employer] work.  He rates associated pain is a 3 out of 10, worse with palpation or movement.  There is no purulent drainage.  There is no associated fever.  He does have erythema of the legs bilaterally which he states has been there for about 6 months.  He has also had edema of the lower legs for about 6 months.  In addition he has had tingling in his toes, primarily in the mornings, over the same period of time.  He has been advised to get himself checked for diabetes.  His sugar here is 140 but he ate within an hour prior to arrival.  He denies polyuria, polydipsia or blurred vision.   Past Medical History:  Diagnosis Date  . Asthma     History reviewed. No pertinent surgical history.  No family history on file.  Social History   Tobacco Use  . Smoking status: Current Every Day Smoker    Packs/day: 1.50    Types: Cigarettes  . Smokeless tobacco: Never Used  Substance Use Topics  . Alcohol use: No  . Drug use: No    Prior to Admission medications   Medication Sig Start Date End Date Taking? Authorizing Provider  albuterol (VENTOLIN HFA) 108 (90 Base) MCG/ACT inhaler Inhale 2 puffs into the lungs every 4 (four) hours as needed for wheezing or shortness of breath. 06/03/19   Amrie Gurganus, Jonny Ruiz, MD    Allergies Patient has no known allergies.   REVIEW OF SYSTEMS  Negative except as noted here or in the History of Present Illness.   PHYSICAL EXAMINATION  Initial Vital Signs Blood pressure (!) 158/78, pulse 78, temperature 98.2 F (36.8 C),  temperature source Oral, resp. rate 20, weight 117.9 kg, SpO2 99 %.  Examination General: Well-developed, well-nourished male in no acute distress; appearance consistent with age of record HENT: normocephalic; atraumatic Eyes: pupils equal, round and reactive to light; extraocular muscles intact Neck: supple Heart: regular rate and rhythm; no murmurs, rubs or gallops Lungs: clear to auscultation bilaterally Abdomen: soft; nondistended; nontender; bowel sounds present Extremities: No deformity; full range of motion; pulses normal; +1 edema of lower legs Neurologic: Awake, alert and oriented; motor function intact in all extremities and symmetric; no facial droop Skin: Warm and dry; chronic appearing stasis changes of lower legs with crusted ulceration of left shin but without surrounding erythema or warmth:    Psychiatric: Normal mood and affect   RESULTS  Summary of this visit's results, reviewed and interpreted by myself:   EKG Interpretation  Date/Time:    Ventricular Rate:    PR Interval:    QRS Duration:   QT Interval:    QTC Calculation:   R Axis:     Text Interpretation:        Laboratory Studies: No results found for this or any previous visit (from the past 24 hour(s)). Imaging Studies: No results found.  ED COURSE and MDM  Nursing notes, initial and subsequent vitals signs, including pulse oximetry, reviewed and interpreted by myself.  Vitals:   06/03/19 0008 06/03/19 0009  BP: (!) 158/78   Pulse: 78   Resp: 20   Temp: 98.2 F (36.8 C)   TempSrc: Oral   SpO2: 99%   Weight:  117.9 kg   Medications  mupirocin cream (BACTROBAN) 2 % (has no administration in time range)    Patient's blood sugar and history are not consistent with diabetes.  He may be pre by diabetic and he was advised to improve his diet and exercise.  He was advised to find a primary care physician as he is at an age where health problems begin to manifest themselves.  We will provide  Bactroban to assist in healing of his wound (likely a venous stasis ulcer), I do not believe referral to the wound care center is indicated at this time.  He was also advised to wear shin guards when working to protect the wound.  PROCEDURES  Procedures   ED DIAGNOSES     ICD-10-CM   1. Venous stasis dermatitis of both lower extremities  I87.2   2. Stasis ulcer of left lower extremity ([place])  I83.029    L97.929        Shanon Rosser, MD 06/03/19 [phone]

## 2019-06-03 NOTE — ED Triage Notes (Signed)
Hit Left shin on fork lift 2 months ago. Redness and dark scab area . Amb.

## 2019-10-01 ENCOUNTER — Encounter (HOSPITAL_BASED_OUTPATIENT_CLINIC_OR_DEPARTMENT_OTHER): Payer: Self-pay | Admitting: Emergency Medicine

## 2019-10-01 ENCOUNTER — Other Ambulatory Visit: Payer: Self-pay

## 2019-10-01 ENCOUNTER — Emergency Department (HOSPITAL_BASED_OUTPATIENT_CLINIC_OR_DEPARTMENT_OTHER)
Admission: EM | Admit: 2019-10-01 | Discharge: 2019-10-01 | Disposition: A | Discharge status: Home / Self Care | Payer: Self-pay | Attending: Emergency Medicine | Admitting: Emergency Medicine

## 2019-10-01 DIAGNOSIS — I872 Venous insufficiency (chronic) (peripheral): Secondary | ICD-10-CM | POA: Insufficient documentation

## 2019-10-01 DIAGNOSIS — J45909 Unspecified asthma, uncomplicated: Secondary | ICD-10-CM | POA: Insufficient documentation

## 2019-10-01 DIAGNOSIS — F17210 Nicotine dependence, cigarettes, uncomplicated: Secondary | ICD-10-CM | POA: Insufficient documentation

## 2019-10-01 DIAGNOSIS — Z7951 Long term (current) use of inhaled steroids: Secondary | ICD-10-CM | POA: Insufficient documentation

## 2019-10-01 DIAGNOSIS — W228XXS Striking against or struck by other objects, sequela: Secondary | ICD-10-CM | POA: Insufficient documentation

## 2019-10-01 MED ORDER — TRIAMCINOLONE ACETONIDE 0.5 % EX OINT: 1.0000 "application " | TOPICAL_OINTMENT | Freq: Two times a day (BID) | CUTANEOUS | 0 refills | Status: DC

## 2019-10-01 MED ORDER — WHITE PETROLATUM EX OINT: 1.0000 "application " | TOPICAL_OINTMENT | CUTANEOUS | 0 refills | Status: DC | PRN

## 2019-10-01 MED ORDER — CEPHALEXIN 500 MG PO CAPS: 500.0000 mg | ORAL_CAPSULE | Freq: Four times a day (QID) | ORAL | 0 refills | Status: DC

## 2019-10-01 NOTE — Discharge Instructions (Signed)
Please take all of your antibiotics until finished!   Take your antibiotics with food.  Common side effects of antibiotics include nausea, vomiting, abdominal discomfort, and diarrhea. You may help offset some of this with probiotics which you can buy or get in yogurt. Do not eat  or take the probiotics until 2 hours after your antibiotic.    Apply the Vaseline after you shower to help get rid of some of the dry skin.  Apply the Kenalog cream to the area twice daily.  Keep the area clean and dry and covered to avoid any additional injury.  When you are not walking keep the extremities elevated.  I would recommend that you purchase compression stockings to help with the venous insufficiency.  I have placed a referral to the wound care team.  They will call you to schedule follow-up.  I would also recommend calling [facility] to schedule primary care services that are more affordable.  You will likely be on the phone on hold for a while, please stay on the line.  Tell them you were referred from the emergency department and schedule an appointment for follow-up.  Return to the emergency department if any concerning signs or symptoms develop such as fevers, shortness of breath or chest pain, loss of consciousness, loss of feeling.

## 2019-10-01 NOTE — ED Provider Notes (Signed)
[facility] EMERGENCY DEPARTMENT Provider Note   CSN: [id number] Arrival date & time: 10/01/19  1946     History Chief Complaint  Patient presents with  . Wound Infection    Raymond Gonzalez is a 49 y.o. male with history of asthma presents for evaluation of ongoing and worsening lower extremity wound.  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  Was seen and evaluated at [facility] ED on 06/03/2019 and diagnosed with venous stasis dermatitis, followed up at an outside hospital for similar symptoms on 08/29/2019.  He states that since then the wound has gotten larger and began draining more.  He reports constant dull pains sometimes burning, occasional sharp stabbing pains to the area.  Pain worsens with palpation and with clothing brushing up against the wound.  Reports similar but less severe symptoms on the right side which seem to be improving.  He has been washing the wound with antibacterial soap and warm water, applying dressing changes, attempting to elevate the extremity without much relief.  He continues to smoke a few cigarettes daily.  Denies fevers.  He has not yet established care with a PCP.  The history is provided by the patient.       Past Medical History:  Diagnosis Date  . Asthma     There are no problems to display for this patient.   History reviewed. No pertinent surgical history.     History reviewed. No pertinent family history.  Social History   Tobacco Use  . Smoking status: Current Every Day Smoker    Packs/day: 1.50    Types: Cigarettes  . Smokeless tobacco: Never Used  Substance Use Topics  . Alcohol use: No  . Drug use: No    Home Medications Prior to Admission medications   Medication Sig Start Date End Date Taking? Authorizing Provider  albuterol (VENTOLIN HFA) 108 (90 Base) MCG/ACT inhaler Inhale 2 puffs into the lungs every 4 (four) hours as needed for  wheezing or shortness of breath. 06/03/19   Molpus, John, MD  cephALEXin (KEFLEX) 500 MG capsule Take 1 capsule (500 mg total) by mouth 4 (four) times daily. 10/01/19   Maryem Shuffler A, PA-C  triamcinolone ointment (KENALOG) 0.5 % Apply 1 application topically 2 (two) times daily. 10/01/19   Dian Minahan A, PA-C  white petrolatum (VASELINE) OINT Apply 1 application topically as needed for dry skin. 10/01/19   Michela Pitcher A, PA-C    Allergies    Patient has no known allergies.  Review of Systems   Review of Systems  Constitutional: Negative for fever.  Cardiovascular: Positive for leg swelling.  Skin: Positive for wound.    Physical Exam Updated Vital Signs BP (!) 152/112 (BP Location: Right Arm)   Pulse 88   Temp 98.4 F (36.9 C) (Oral)   Resp 18   Ht 6\' 1"  (1.854 m)   Wt 122.5 kg   SpO2 98%   BMI 35.62 kg/m   Physical Exam Vitals and nursing note reviewed.  Constitutional:      General: He is not in acute distress.    Appearance: He is well-developed.  HENT:     Head: Normocephalic and atraumatic.  Eyes:     General:        Right eye: No discharge.        Left eye: No discharge.     Conjunctiva/sclera: Conjunctivae normal.  Neck:     Vascular: No JVD.     Trachea: No tracheal deviation.  Cardiovascular:     Rate and Rhythm: Normal rate.     Pulses: Normal pulses.     Comments: 2+ DP/PT pulses bilaterally.  2+ pitting edema of the bilateral lower extremities. Pulmonary:     Effort: Pulmonary effort is normal.     Comments: Speaking in full sentences without difficulty. Abdominal:     General: There is no distension.  Musculoskeletal:     Right lower leg: Edema present.     Left lower leg: Edema present.     Comments: See below images. Patient with skin changes to the anterior aspect of the bilateral lower extremities consistent with venous stasis dermatitis.  He has a 5 cm ulceration to the anterior left lower leg which is oozing serous fluid.  There is some  surrounding tenderness to palpation.  No streaking up the extremity.  Compartments are soft.  Skin:    General: Skin is warm and dry.     Capillary Refill: Capillary refill takes less than 2 seconds.  Neurological:     Mental Status: He is alert.     Comments: Sensation intact to light touch of bilateral lower extremities.  Psychiatric:        Behavior: Behavior normal.          ED Results / Procedures / Treatments   Labs (all labs ordered are listed, but only abnormal results are displayed) Labs Reviewed - No data to display  EKG None  Radiology No results found.  Procedures Procedures (including critical care time)  Medications Ordered in ED Medications - No data to display  ED Course  I have reviewed the triage vital signs and the nursing notes.  Pertinent labs & imaging results that were available during my care of the patient were reviewed by me and considered in my medical decision making (see chart for details).    MDM Rules/Calculators/A&P                          Patient presenting for evaluation of worsening rash involving the lower extremities, worse on the left side.  Patient is afebrile, somewhat hypertensive in the ED.  Vital signs otherwise stable.  He is neurovascularly intact.  He has been seen multiple times in various emergency departments for this rash over the course of the year and has been diagnosed with stasis dermatitis.  He has not yet followed up with a PCP for management of this.  I think at this point he warrants follow-up with wound care as well so I will place a referral for this.  His physical examination is overall reassuring with mild signs of infection but no concern for necrotizing fasciitis or DVT.  We discussed management of symptoms with use of petroleum, topical steroids, compression stockings and elevation.  I emphasized the importance of smoking cessation and follow-up with a PCP for management of any chronic medical conditions and  for further evaluation of venous insufficiency/stasis dermatitis.  Will discharge with short course of Keflex as well for management of mild cellulitis.  Discussed strict ED return precautions.  Patient verbalized understanding of and agreement with plan and patient is stable for discharge at this time.   Final Clinical Impression(s) / ED Diagnoses Final diagnoses:  Stasis dermatitis of both legs    Rx / DC Orders ED Discharge Orders  Ordered    AMB referral to wound care center     Discontinue  Reprint     10/01/19 2055    triamcinolone ointment (KENALOG) 0.5 %  2 times daily     Discontinue  Reprint     10/01/19 2057    white petrolatum (VASELINE) OINT  As needed     Discontinue  Reprint     10/01/19 2057    cephALEXin (KEFLEX) 500 MG capsule  4 times daily     Discontinue  Reprint     10/01/19 2058           Jeanie Sewer, PA-C 10/02/19 1649    Long, Arlyss Repress, MD 10/08/19 2037

## 2019-10-01 NOTE — ED Triage Notes (Signed)
Patient states that he has had a "whole" in his right lower leg shin area for about a year. He reports that it has started to drain and become worse. The patient has a noted wound with serious saingous fluid and reddened and purple periwound

## 2019-11-02 ENCOUNTER — Encounter (HOSPITAL_BASED_OUTPATIENT_CLINIC_OR_DEPARTMENT_OTHER): Payer: Self-pay | Attending: Physician Assistant | Admitting: Physician Assistant

## 2020-02-01 ENCOUNTER — Emergency Department (HOSPITAL_BASED_OUTPATIENT_CLINIC_OR_DEPARTMENT_OTHER)
Admission: EM | Admit: 2020-02-01 | Discharge: 2020-02-01 | Disposition: A | Discharge status: Home / Self Care | Payer: Self-pay | Attending: Emergency Medicine | Admitting: Emergency Medicine

## 2020-02-01 ENCOUNTER — Encounter (HOSPITAL_BASED_OUTPATIENT_CLINIC_OR_DEPARTMENT_OTHER): Payer: Self-pay | Admitting: *Deleted

## 2020-02-01 ENCOUNTER — Other Ambulatory Visit: Payer: Self-pay

## 2020-02-01 ENCOUNTER — Emergency Department (HOSPITAL_BASED_OUTPATIENT_CLINIC_OR_DEPARTMENT_OTHER): Payer: Self-pay

## 2020-02-01 DIAGNOSIS — L97909 Non-pressure chronic ulcer of unspecified part of unspecified lower leg with unspecified severity: Secondary | ICD-10-CM

## 2020-02-01 DIAGNOSIS — F17210 Nicotine dependence, cigarettes, uncomplicated: Secondary | ICD-10-CM | POA: Insufficient documentation

## 2020-02-01 DIAGNOSIS — X58XXXA Exposure to other specified factors, initial encounter: Secondary | ICD-10-CM | POA: Insufficient documentation

## 2020-02-01 DIAGNOSIS — J45909 Unspecified asthma, uncomplicated: Secondary | ICD-10-CM | POA: Insufficient documentation

## 2020-02-01 DIAGNOSIS — L97929 Non-pressure chronic ulcer of unspecified part of left lower leg with unspecified severity: Secondary | ICD-10-CM | POA: Insufficient documentation

## 2020-02-01 DIAGNOSIS — I83029 Varicose veins of left lower extremity with ulcer of unspecified site: Secondary | ICD-10-CM | POA: Insufficient documentation

## 2020-02-01 LAB — HEMOGLOBIN A1C
Hgb A1c MFr Bld: 5.7 % — ABNORMAL HIGH (ref 4.8–5.6)
Mean Plasma Glucose: 116.89 mg/dL

## 2020-02-01 MED ORDER — DOXYCYCLINE HYCLATE 100 MG PO CAPS: 100.0000 mg | ORAL_CAPSULE | Freq: Two times a day (BID) | ORAL | 0 refills | Status: DC

## 2020-02-01 NOTE — ED Triage Notes (Signed)
Left lower leg wound for a year and half.   Was here 2 months ago and was referred to a wound specialist but he has no insurance.

## 2020-02-01 NOTE — Progress Notes (Addendum)
TOC CM reviewed referral will have pt follow up with [facility] on tomorrow for appt time. Will call tomorrow to schedule appt for patient as clinic is currently closed. Isidoro Donning RN CCM, [facility] ED TOC CM [phone]

## 2020-02-01 NOTE — Discharge Instructions (Signed)
Change the dressing 1 time a day and continue to use the compression socks but get a bigger one for the left leg.  Elevated the legs when sitting.  You need to see wound care and the regular doctor should be able to help you with that.

## 2020-02-01 NOTE — ED Provider Notes (Signed)
[facility] EMERGENCY DEPARTMENT Provider Note   CSN: [id number] Arrival date & time: 02/01/20  1630     History Chief Complaint  Patient presents with  . Wound    Raymond Gonzalez is a 49 y.o. male.  Patient is a 49 year old male with a history of chronic venous stasis bilaterally with ongoing issues with a nonhealing wound in the left lower extremity and daily tobacco use who presents today with worsening of his wound.  Patient reports he lost his job tried to see the wound care specialist but was unable to be seen because he could not pay.  Patient reports that he has been wearing compression socks daily which does help with the swelling in his legs and does help with some of the drainage from his left leg and with some of the pain however he tries to put Vaseline over the wound but the gauze gets stuck in the wound and when he has to pull it off it is pulling off more skin.  Over the last 2 to 3 days he has noticed more redness on the normal skin of his leg and more pain in the leg.  He denies any change in the skin color or swelling of his foot.  However to walk does cause more pain than what it had.  He has no systemic symptoms such as fever, malaise or anorexia.  Patient takes no medications at this time except for he bought amoxicillin online and has been taking 500 mg multiple times daily.  Patient reports that he has prior history of DVT however he reports he has never been on anticoagulation and unclear if this is true or not.  The history is provided by the patient.       Past Medical History:  Diagnosis Date  . Asthma     There are no problems to display for this patient.   History reviewed. No pertinent surgical history.     History reviewed. No pertinent family history.  Social History   Tobacco Use  . Smoking status: Current Every Day Smoker    Packs/day: 1.50    Types: Cigarettes  . Smokeless tobacco: Never Used  Substance Use Topics  . Alcohol  use: No  . Drug use: No    Home Medications Prior to Admission medications   Medication Sig Start Date End Date Taking? Authorizing Provider  albuterol (VENTOLIN HFA) 108 (90 Base) MCG/ACT inhaler Inhale 2 puffs into the lungs every 4 (four) hours as needed for wheezing or shortness of breath. 06/03/19   Molpus, John, MD  cephALEXin (KEFLEX) 500 MG capsule Take 1 capsule (500 mg total) by mouth 4 (four) times daily. 10/01/19   Fawze, Mina A, PA-C  triamcinolone ointment (KENALOG) 0.5 % Apply 1 application topically 2 (two) times daily. 10/01/19   Fawze, Mina A, PA-C  white petrolatum (VASELINE) OINT Apply 1 application topically as needed for dry skin. 10/01/19   Michela Pitcher A, PA-C    Allergies    Patient has no known allergies.  Review of Systems   Review of Systems  All other systems reviewed and are negative.   Physical Exam Updated Vital Signs BP (!) 143/102   Pulse 98   Temp 97.9 F (36.6 C) (Oral)   Resp 16   Ht 6\' 1"  (1.854 m)   Wt 120.2 kg   SpO2 96%   BMI 34.96 kg/m   Physical Exam Vitals and nursing note reviewed.  Constitutional:  General: He is not in acute distress.    Appearance: Normal appearance. He is obese.  HENT:     Head: Normocephalic.  Eyes:     Pupils: Pupils are equal, round, and reactive to light.  Cardiovascular:     Rate and Rhythm: Normal rate.     Pulses: Normal pulses.  Pulmonary:     Effort: Pulmonary effort is normal.     Breath sounds: Normal breath sounds.  Musculoskeletal:       Legs:     Comments: Findings on the lower extremities bilaterally with skin changes consistent with chronic venous stasis.  Right lower extremity without any evidence of wounds and 2+ DP pulse.  Left lower extremity with wound as described as above.  2+ DP pulse with normal sensation in the left foot.  Skin:    General: Skin is warm.     Capillary Refill: Capillary refill takes 2 to 3 seconds.  Neurological:     General: No focal deficit present.      Mental Status: He is alert and oriented to person, place, and time. Mental status is at baseline.  Psychiatric:        Mood and Affect: Mood normal.        Behavior: Behavior normal.        Thought Content: Thought content normal.     ED Results / Procedures / Treatments   Labs (all labs ordered are listed, but only abnormal results are displayed) Labs Reviewed  HEMOGLOBIN A1C    EKG None  Radiology US Venous Img Lower  Left (DVT Study)  Result Date: 02/01/2020 CLINICAL DATA:  Pain and swelling. Left lower extremity chronic wound. EXAM: LEFT LOWER EXTREMITY VENOUS DOPPLER ULTRASOUND TECHNIQUE: Gray-scale sonography with compression, as well as color and duplex ultrasound, were performed to evaluate the deep venous system(s) from the level of the common femoral vein through the popliteal and proximal calf veins. COMPARISON:  None. FINDINGS: VENOUS Normal compressibility of the common femoral, superficial femoral, and popliteal veins, as well as the visualized calf veins. Visualized portions of profunda femoral vein and great saphenous vein unremarkable. No filling defects to suggest DVT on grayscale or color Doppler imaging. Doppler waveforms show normal direction of venous flow, normal respiratory plasticity and response to augmentation. Limited views of the contralateral common femoral vein are unremarkable. OTHER None. Limitations: none IMPRESSION: Negative. Electronically Signed   By: Signa Kell M.D.   On: 02/01/2020 18:51    Procedures Procedures (including critical care time)  Medications Ordered in ED Medications - No data to display  ED Course  I have reviewed the triage vital signs and the nursing notes.  Pertinent labs & imaging results that were available during my care of the patient were reviewed by me and considered in my medical decision making (see chart for details).    MDM Rules/Calculators/A&P                          Patient presenting today with  worsening wound on the left shin that is been present for approximately a year and a half but worsening over the last for 5 days.  Patient does use compression socks daily which does help with the swelling however he has been putting ointments on his leg and then gauze which gets embedded into the wound and when he removes it it rips off his skin.  There is some surrounding erythema which does look like a mild  cellulitis but he has no systemic symptoms.  Patient reports he has a history of DVT however there is no documentation of this and he reports he has never been on anticoagulation.  We will do an ultrasound here to confirm no signs of DVT.  Patient has no systemic symptoms there is erythema but no significant warmth.  He would benefit from a short course of doxycycline but patient needs wound care.  This is complicated by the fact that he lost his job and insurance and reports nobody would see him.  Will discuss with transitional care team for PCP follow-up and possible wound care at home.  Patient's wound today was addressed with Vaseline impregnated gauze, no stick adhesive bandage and was wrapped with Kerlix.  He was encouraged to continue using his compression sock and elevating his leg when he could.  7:10 PM [place] neg for DVT.  Pt given doxy and f/u with [facility].  Pt given supplies for wound care and return precautions.  MDM Number of Diagnoses or Management Options   Amount and/or Complexity of Data Reviewed Tests in the radiology section of CPT: ordered and reviewed Independent visualization of images, tracings, or specimens: yes  Risk of Complications, Morbidity, and/or Mortality Presenting problems: moderate Diagnostic procedures: low Management options: low  Patient Progress Patient progress: stable   Final Clinical Impression(s) / ED Diagnoses Final diagnoses:  Chronic cutaneous venous stasis ulcer (HCC)    Rx / DC Orders ED Discharge Orders         Ordered     doxycycline (VIBRAMYCIN) 100 MG capsule  2 times daily        02/01/20 1908           Gwyneth Sprout, MD 02/01/20 1910

## 2020-02-08 ENCOUNTER — Ambulatory Visit: Payer: Self-pay | Attending: Nurse Practitioner | Admitting: Nurse Practitioner

## 2020-02-08 ENCOUNTER — Encounter: Payer: Self-pay | Admitting: Nurse Practitioner

## 2020-02-08 ENCOUNTER — Other Ambulatory Visit: Payer: Self-pay

## 2020-02-08 VITALS — BP 123/74 | HR 85 | Temp 97.7°F | Ht 73.0 in | Wt 294.0 lb

## 2020-02-08 DIAGNOSIS — Z1211 Encounter for screening for malignant neoplasm of colon: Secondary | ICD-10-CM

## 2020-02-08 DIAGNOSIS — Z130 Encounter for screening for diseases of the blood and blood-forming organs and certain disorders involving the immune mechanism: Secondary | ICD-10-CM

## 2020-02-08 DIAGNOSIS — I872 Venous insufficiency (chronic) (peripheral): Secondary | ICD-10-CM

## 2020-02-08 DIAGNOSIS — Z114 Encounter for screening for human immunodeficiency virus [HIV]: Secondary | ICD-10-CM

## 2020-02-08 DIAGNOSIS — J4520 Mild intermittent asthma, uncomplicated: Secondary | ICD-10-CM

## 2020-02-08 DIAGNOSIS — Z09 Encounter for follow-up examination after completed treatment for conditions other than malignant neoplasm: Secondary | ICD-10-CM

## 2020-02-08 DIAGNOSIS — I10 Essential (primary) hypertension: Secondary | ICD-10-CM

## 2020-02-08 DIAGNOSIS — Z1159 Encounter for screening for other viral diseases: Secondary | ICD-10-CM

## 2020-02-08 DIAGNOSIS — R7303 Prediabetes: Secondary | ICD-10-CM

## 2020-02-08 LAB — GLUCOSE, POCT (MANUAL RESULT ENTRY): POC Glucose: 106 mg/dl — AB (ref 70–99)

## 2020-02-08 MED ORDER — ALBUTEROL SULFATE HFA 108 (90 BASE) MCG/ACT IN AERS: 2.0000 | INHALATION_SPRAY | RESPIRATORY_TRACT | 1 refills | Status: DC | PRN

## 2020-02-08 MED FILL — ALBUTEROL SULFATE HFA 108 (: 108 (90 BAS | 16 days supply | Qty: 18 | Fill #0

## 2020-02-08 NOTE — Progress Notes (Signed)
Assessment & Plan:  Raymond Gonzalez was seen today for follow-up.  Diagnoses and all orders for this visit:  Hospital discharge follow-up  Essential hypertension -     amLODipine (NORVASC) 5 MG tablet; Take 1 tablet (5 mg total) by mouth daily. Continue all antihypertensives as prescribed.  Remember to bring in your blood pressure log with you for your follow up appointment.  DASH/Mediterranean Diets are healthier choices for HTN.    Screening for deficiency anemia -     CBC  Prediabetes -     Glucose (CBG) -     CMP14+EGFR -     Lipid panel  Colon cancer screening -     Fecal occult blood, imunochemical([facility]/Sun[facility])  Need for hepatitis C screening test -     Cancel: Hepatitis C Antibody -     HCV Ab w Reflex to Quant PCR  Encounter for screening for HIV -     HIV antibody (with reflex)  Chronic venous stasis dermatitis -     AMB referral to wound care center  Mild intermittent asthma without complication -     albuterol (VENTOLIN HFA) 108 (90 Base) MCG/ACT inhaler; Inhale 2 puffs into the lungs every 4 (four) hours as needed for wheezing or shortness of breath. PASS PROGRAM    Patient has been counseled on age-appropriate routine health concerns for screening and prevention. These are reviewed and up-to-date. Referrals have been placed accordingly. Immunizations are up-to-date or declined.    Subjective:   Chief Complaint  Patient presents with  . Follow-up    Pt. is here for emeregency room F.U.    HPI Raymond Gonzalez 49 y.o. male presents to office today for Hospital follow up and to establish care. He has a history of Bilateral chronic venous stasis dermatitis and most recently venous stasis ulcer of the left leg. He has had numerous ED visits for this and has not established with a PCP or wound care specialist. He was a no show for his wound care appointment on 11-02-2019. He has been instructed today the he should not miss his next appointment as I will be  referring him back to wound care today. He has 3 more days till completion of his doxycycline he was prescribed at his last ED visit on 02-01-2020. He is dressing the wound with vaseline gauze, kerlix and wearing compression socks which are ill fitted today. He is a smoker     Essential Hypertension He has never been treated for HTN. Blood pressure has been uncontrolled for several years.  Will start low dose amlodipine 5 mg daily and have him return in 3 weeks for BP follow up. Denies chest pain, shortness of breath, palpitations, lightheadedness, dizziness, headaches. BP Readings from Last 3 Encounters:  02/08/20 123/74  02/01/20 (!) 137/92  10/01/19 (!) 152/112     Review of Systems  Constitutional: Negative for fever, malaise/fatigue and weight loss.  HENT: Negative.  Negative for nosebleeds.   Eyes: Negative.  Negative for blurred vision, double vision and photophobia.  Respiratory: Negative.  Negative for cough and shortness of breath.   Cardiovascular: Negative.  Negative for chest pain, palpitations and leg swelling.  Gastrointestinal: Negative.  Negative for heartburn, nausea and vomiting.  Musculoskeletal: Negative.  Negative for myalgias.  Skin:       SEE HPI  Neurological: Negative.  Negative for dizziness, focal weakness, seizures and headaches.  Psychiatric/Behavioral: Negative.  Negative for suicidal ideas.    Past Medical History:  Diagnosis Date  .  Asthma   . Prediabetes     Past Surgical History:  Procedure Laterality Date  . NO PAST SURGERIES      Family History  Problem Relation Age of Onset  . Depression Neg Hx     Social History Reviewed with no changes to be made today.   Outpatient Medications Prior to Visit  Medication Sig Dispense Refill  . doxycycline (VIBRAMYCIN) 100 MG capsule Take 1 capsule (100 mg total) by mouth 2 (two) times daily. 14 capsule 0  . triamcinolone ointment (KENALOG) 0.5 % Apply 1 application topically 2 (two) times  daily. 15 g 0  . white petrolatum (VASELINE) OINT Apply 1 application topically as needed for dry skin. 30 g 0  . albuterol (VENTOLIN HFA) 108 (90 Base) MCG/ACT inhaler Inhale 2 puffs into the lungs every 4 (four) hours as needed for wheezing or shortness of breath. 18 g 1  . cephALEXin (KEFLEX) 500 MG capsule Take 1 capsule (500 mg total) by mouth 4 (four) times daily. 20 capsule 0   No facility-administered medications prior to visit.    No Known Allergies     Objective:    BP 123/74 (BP Location: Right Arm, Patient Position: Sitting, Cuff Size: Large)   Pulse 85   Temp 97.7 F (36.5 C) (Temporal)   Ht _0  (1.854 m)   Wt 294 lb (133.4 kg)   SpO2 97%   BMI 38.79 kg/m  Wt Readings from Last 3 Encounters:  02/08/20 294 lb (133.4 kg)  02/01/20 265 lb (120.2 kg)  10/01/19 270 lb (122.5 kg)    Physical Exam Vitals and nursing note reviewed.  Constitutional:      Appearance: He is well-developed.  HENT:     Head: Normocephalic and atraumatic.  Cardiovascular:     Rate and Rhythm: Normal rate and regular rhythm.     Heart sounds: Normal heart sounds. No murmur heard.  No friction rub. No gallop.   Pulmonary:     Effort: Pulmonary effort is normal. No tachypnea or respiratory distress.     Breath sounds: Normal breath sounds. No decreased breath sounds, wheezing, rhonchi or rales.  Chest:     Chest wall: No tenderness.  Abdominal:     General: Bowel sounds are normal.     Palpations: Abdomen is soft.  Musculoskeletal:        General: Normal range of motion.     Cervical back: Normal range of motion.  Skin:    General: Skin is warm and dry.     Comments: SEE PHOTO  Neurological:     Mental Status: He is alert and oriented to person, place, and time.     Coordination: Coordination normal.  Psychiatric:        Behavior: Behavior normal. Behavior is cooperative.        Thought Content: Thought content normal.        Judgment: Judgment normal.          Patient  has been counseled extensively about nutrition and exercise as well as the importance of adherence with medications and regular follow-up. The patient was given clear instructions to go to ER or return to medical center if symptoms don't improve, worsen or new problems develop. The patient verbalized understanding.   Follow-up: Return for 3 weeks BP check with [facility] .   Gildardo Pounds, FNP-BC [facility] [place], [place]   02/11/2020, 1:49 PM

## 2020-02-11 ENCOUNTER — Encounter: Payer: Self-pay | Admitting: Nurse Practitioner

## 2020-02-11 MED ORDER — AMLODIPINE BESYLATE 5 MG PO TABS: 5.0000 mg | ORAL_TABLET | Freq: Every day | ORAL | 3 refills | Status: DC

## 2020-02-23 ENCOUNTER — Telehealth: Payer: Self-pay

## 2020-02-23 NOTE — Telephone Encounter (Signed)
Attempt to reach patient to schedule a BP check w/ clinical pharmacist.  No answer and LVM. Patient was about to get his blood work done.

## 2020-03-05 ENCOUNTER — Other Ambulatory Visit: Payer: Self-pay

## 2020-03-05 ENCOUNTER — Encounter (HOSPITAL_BASED_OUTPATIENT_CLINIC_OR_DEPARTMENT_OTHER): Payer: Medicaid Other | Attending: Internal Medicine | Admitting: Internal Medicine

## 2020-03-05 DIAGNOSIS — S91331A Puncture wound without foreign body, right foot, initial encounter: Secondary | ICD-10-CM | POA: Insufficient documentation

## 2020-03-05 DIAGNOSIS — W450XXA Nail entering through skin, initial encounter: Secondary | ICD-10-CM | POA: Insufficient documentation

## 2020-03-05 DIAGNOSIS — L97821 Non-pressure chronic ulcer of other part of left lower leg limited to breakdown of skin: Secondary | ICD-10-CM | POA: Insufficient documentation

## 2020-03-05 DIAGNOSIS — I872 Venous insufficiency (chronic) (peripheral): Secondary | ICD-10-CM | POA: Insufficient documentation

## 2020-03-06 NOTE — Progress Notes (Signed)
Raymond Gonzalez, Raymond Gonzalez ([id number]) Visit Report for 03/05/2020 Allergy List Details Patient Name: Date of Service: Raymond Gonzalez, [name] TTHEW 03/05/2020 1:15 PM Medical Record Number: [id number] Patient Account Number: [id number] Date of Birth/Sex: Treating RN: [birth year] (49 y.o. Male) Zenaida Deed Primary Care Ambermarie Honeyman: Bertram Denver Other Clinician: Referring Jakylah Bassinger: Treating Jacelyn Cuen/Extender: Davene Costain, Shon Hale Weeks in Treatment: 0 Allergies Active Allergies No Known Allergies Allergy Notes Electronic Signature(s) Signed: 03/06/2020 4:37:04 PM By: Yevonne Pax RN Entered By: Yevonne Pax on 03/06/2020 08:38:44 -------------------------------------------------------------------------------- Arrival Information Details Patient Name: Date of Service: Raymond Clara [place], [name] 03/05/2020 1:15 PM Medical Record Number: [id number] Patient Account Number: [id number] Date of Birth/Sex: Treating RN: 03/14/1971 (48 y.o. Male) Zenaida Deed Primary Care Atilano Covelli: Bertram Denver Other Clinician: Referring Waris Rodger: Treating Taiz Bickle/Extender: Neale Burly in Treatment: 0 Visit Information Patient Arrived: Ambulatory Arrival Time: 13:30 Transfer Assistance: None Patient Identification Verified: Yes Secondary Verification Process Completed: Yes Patient Requires Transmission-Based Precautions: No Patient Has Alerts: No Electronic Signature(s) Signed: 03/06/2020 4:37:04 PM By: Yevonne Pax RN Entered By: Yevonne Pax on 03/06/2020 08:38:22 -------------------------------------------------------------------------------- Clinic Level of Care Assessment Details Patient Name: Date of Service: Raymond Gonzalez, [name] TTHEW 03/05/2020 1:15 PM Medical Record Number: [id number] Patient Account Number: [id number] Date of Birth/Sex: Treating RN: 1971/02/02 (48 y.o. Male) Zandra Abts Primary Care Kahdijah Errickson: Other Clinician: Bertram Denver Referring Mikolaj Woolstenhulme: Treating  Larnie Heart/Extender: Neale Burly in Treatment: 0 Clinic Level of Care Assessment Items TOOL 1 Quantity Score X- 1 0 Use when EandM and Procedure is performed on INITIAL visit ASSESSMENTS - Nursing Assessment / Reassessment X- 1 20 General Physical Exam (combine w/ comprehensive assessment (listed just below) when performed on new pt. evals) X- 1 25 Comprehensive Assessment (HX, ROS, Risk Assessments, Wounds Hx, etc.) ASSESSMENTS - Wound and Skin Assessment / Reassessment  - 0 Dermatologic / Skin Assessment (not related to wound area) ASSESSMENTS - Ostomy and/or Continence Assessment and Care  - 0 Incontinence Assessment and Management  - 0 Ostomy Care Assessment and Management (repouching, etc.) PROCESS - Coordination of Care X - Simple Patient / Family Education for ongoing care 1 15  - 0 Complex (extensive) Patient / Family Education for ongoing care X- 1 10 Staff obtains Chiropractor, Records, T Results / Process Orders est  - 0 Staff telephones HHA, Nursing Homes / Clarify orders / etc  - 0 Routine Transfer to another Facility (non-emergent condition)  - 0 Routine Hospital Admission (non-emergent condition) X- 1 15 New Admissions / Manufacturing engineer / Ordering NPWT Apligraf, etc. ,  - 0 Emergency Hospital Admission (emergent condition) PROCESS - Special Needs  - 0 Pediatric / Minor Patient Management  - 0 Isolation Patient Management  - 0 Hearing / Language / Visual special needs  - 0 Assessment of Community assistance (transportation, D/C planning, etc.)  - 0 Additional assistance / Altered mentation  - 0 Support Surface(s) Assessment (bed, cushion, seat, etc.) INTERVENTIONS - Miscellaneous  - 0 External ear exam  - 0 Patient Transfer (multiple staff / Nurse, adult / Similar devices)  - 0 Simple Staple / Suture removal (25 or less)  - 0 Complex Staple / Suture removal (26 or more)  -  0 Hypo/Hyperglycemic Management (do not check if billed separately)  - 0 Ankle / Brachial Index (ABI) - do not check if billed separately Has the patient been seen at the hospital within the last three years: Yes Total Score: 85 Level Of Care: New/Established - Level 3 Electronic Signature(s)  Signed: 03/06/2020 5:24:10 PM By: Zandra Abts RN, BSN Entered By: Zandra Abts on 03/06/2020 15:59:25 -------------------------------------------------------------------------------- Compression Therapy Details Patient Name: Date of Service: Raymond Clara [place], [name] 03/05/2020 1:15 PM Medical Record Number: [id number] Patient Account Number: [id number] Date of Birth/Sex: Treating RN: 04-12-1970 (48 y.o. Male) Zandra Abts Primary Care Dominick Zertuche: Bertram Denver Other Clinician: Referring Yumna Ebers: Treating Madilynn Montante/Extender: Neale Burly in Treatment: 0 Compression Therapy Performed for Wound Assessment: Wound #1 Left,Proximal,Anterior Lower Leg Performed By: Clinician Zandra Abts, RN Compression Type: Four Layer Post Procedure Diagnosis Same as Pre-procedure Electronic Signature(s) Signed: 03/06/2020 5:24:10 PM By: Zandra Abts RN, BSN Entered By: Zandra Abts on 03/06/2020 15:52:43 -------------------------------------------------------------------------------- Compression Therapy Details Patient Name: Date of Service: Raymond Clara NE, [name] 03/05/2020 1:15 PM Medical Record Number: [id number] Patient Account Number: [id number] Date of Birth/Sex: Treating RN: April 14, 1970 (48 y.o. Male) Zandra Abts Primary Care Caige Almeda: Bertram Denver Other Clinician: Referring Nayel Purdy: Treating Ganon Demasi/Extender: Neale Burly in Treatment: 0 Compression Therapy Performed for Wound Assessment: Wound #2 Left,Proximal,Lateral Lower Leg Performed By: Clinician Zandra Abts, RN Compression Type: Four Layer Post Procedure Diagnosis Same as  Pre-procedure Electronic Signature(s) Signed: 03/06/2020 5:24:10 PM By: Zandra Abts RN, BSN Entered By: Zandra Abts on 03/06/2020 15:52:43 -------------------------------------------------------------------------------- Compression Therapy Details Patient Name: Date of Service: Raymond Clara NE, [name] 03/05/2020 1:15 PM Medical Record Number: [id number] Patient Account Number: [id number] Date of Birth/Sex: Treating RN: December 11, 1970 (48 y.o. Male) Zandra Abts Primary Care Younique Casad: Bertram Denver Other Clinician: Referring Jelan Batterton: Treating Jeniffer Culliver/Extender: Neale Burly in Treatment: 0 Compression Therapy Performed for Wound Assessment: Wound #3 Left,Anterior Lower Leg Performed By: Clinician Zandra Abts, RN Compression Type: Four Layer Post Procedure Diagnosis Same as Pre-procedure Electronic Signature(s) Signed: 03/06/2020 5:24:10 PM By: Zandra Abts RN, BSN Signed: 03/06/2020 5:24:10 PM By: Zandra Abts RN, BSN Entered By: Zandra Abts on 03/06/2020 15:52:43 -------------------------------------------------------------------------------- Compression Therapy Details Patient Name: Date of Service: Raymond Clara NE, [name] 03/05/2020 1:15 PM Medical Record Number: [id number] Patient Account Number: [id number] Date of Birth/Sex: Treating RN: 1970/08/24 (48 y.o. Male) Zandra Abts Primary Care Hobson Lax: Bertram Denver Other Clinician: Referring Brettney Ficken: Treating Delainee Tramel/Extender: Neale Burly in Treatment: 0 Compression Therapy Performed for Wound Assessment: Wound #4 Left,Distal,Anterior Lower Leg Performed By: Clinician Zandra Abts, RN Compression Type: Four Layer Post Procedure Diagnosis Same as Pre-procedure Electronic Signature(s) Signed: 03/06/2020 5:24:10 PM By: Zandra Abts RN, BSN Entered By: Zandra Abts on 03/06/2020  15:52:43 -------------------------------------------------------------------------------- Compression Therapy Details Patient Name: Date of Service: Raymond Clara NE, [name] 03/05/2020 1:15 PM Medical Record Number: [id number] Patient Account Number: [id number] Date of Birth/Sex: Treating RN: [birth year] (48 y.o. Male) Zandra Abts Primary Care Jaszmine Navejas: Bertram Denver Other Clinician: Referring Eryk Beavers: Treating Clotilde Loth/Extender: Neale Burly in Treatment: 0 Compression Therapy Performed for Wound Assessment: Wound #5 Left,Distal,Lateral Lower Leg Performed By: Clinician Zandra Abts, RN Compression Type: Four Layer Post Procedure Diagnosis Same as Pre-procedure Electronic Signature(s) Signed: 03/06/2020 5:24:10 PM By: Zandra Abts RN, BSN Entered By: Zandra Abts on 03/06/2020 15:52:44 -------------------------------------------------------------------------------- Compression Therapy Details Patient Name: Date of Service: Raymond Clara [place], [name] 03/05/2020 1:15 PM Medical Record Number: [id number] Patient Account Number: [id number] Date of Birth/Sex: Treating RN: 01-27-1971 (48 y.o. Male) Zandra Abts Primary Care Riddik Senna: Bertram Denver Other Clinician: Referring Ambers Iyengar: Treating Temple Sporer/Extender: Neale Burly in Treatment: 0 Compression Therapy Performed for Wound Assessment: Wound #6 Left,Distal,Medial Lower Leg Performed By: Clinician Zandra Abts, RN Compression  Type: Four Layer Post Procedure Diagnosis Same as Pre-procedure Electronic Signature(s) Signed: 03/06/2020 5:24:10 PM By: Zandra Abts RN, BSN Entered By: Zandra Abts on 03/06/2020 15:52:44 -------------------------------------------------------------------------------- Encounter Discharge Information Details Patient Name: Date of Service: Raymond Clara [place], [name] 03/05/2020 1:15 PM Medical Record Number: [id number] Patient Account Number:  [id number] Date of Birth/Sex: Treating RN: [birth year] (48 y.o. Male) Shawn Stall Primary Care Jazen Spraggins: Bertram Denver Other Clinician: Referring Wynter Isaacs: Treating Damonique Brunelle/Extender: Neale Burly in Treatment: 0 Encounter Discharge Information Items Discharge Condition: Stable Ambulatory Status: Ambulatory Discharge Destination: Home Transportation: Private Auto Accompanied By: self Schedule Follow-up Appointment: Yes Clinical Summary of Care: Electronic Signature(s) Signed: 03/06/2020 4:43:44 PM By: Shawn Stall Entered By: Shawn Stall on 03/06/2020 16:40:40 -------------------------------------------------------------------------------- Lower Extremity Assessment Details Patient Name: Date of Service: Raymond Gonzalez, [name] TTHEW 03/05/2020 1:15 PM Medical Record Number: [id number] Patient Account Number: [id number] Date of Birth/Sex: Treating RN: May 25, 1970 (48 y.o. Male) Yevonne Pax Primary Care Kasy Iannacone: Bertram Denver Other Clinician: Referring Pauletta Pickney: Treating Craig Ionescu/Extender: Davene Costain, Fredia Beets in Treatment: 0 Edema Assessment Assessed: [Left: No] [Right: No] E[Left: dema] [Right: :] Calf Left: Right: Point of Measurement: 46 cm From Medial Instep 48 cm Ankle Left: Right: Point of Measurement: 11 cm From Medial Instep 29 cm Vascular Assessment Pulses: Dorsalis Pedis Palpable: [Right:Yes] Electronic Signature(s) Signed: 03/06/2020 4:37:04 PM By: Yevonne Pax RN Entered By: Yevonne Pax on 03/06/2020 08:47:32 -------------------------------------------------------------------------------- Multi-Disciplinary Care Plan Details Patient Name: Date of Service: Raymond Clara [place], [name] 03/05/2020 1:15 PM Medical Record Number: [id number] Patient Account Number: [id number] Date of Birth/Sex: Treating RN: 15-Dec-1970 (48 y.o. Male) Zandra Abts Primary Care Antowan Samford: Bertram Denver Other Clinician: Referring  Cailean Heacock: Treating Kayla Deshaies/Extender: Neale Burly in Treatment: 0 Active Inactive Venous Leg Ulcer Nursing Diagnoses: Knowledge deficit related to disease process and management Potential for venous Insuffiency (use before diagnosis confirmed) Goals: Patient will maintain optimal edema control Date Initiated: 03/06/2020 Target Resolution Date: 04/06/2020 Goal Status: Active Patient/caregiver will verbalize understanding of disease process and disease management Date Initiated: 03/06/2020 Target Resolution Date: 04/06/2020 Goal Status: Active Interventions: Assess peripheral edema status every visit. Compression as ordered Provide education on venous insufficiency Notes: Wound/Skin Impairment Nursing Diagnoses: Impaired tissue integrity Knowledge deficit related to smoking impact on wound healing Knowledge deficit related to ulceration/compromised skin integrity Goals: Patient will demonstrate a reduced rate of smoking or cessation of smoking Date Initiated: 03/06/2020 Target Resolution Date: 04/06/2020 Goal Status: Active Patient/caregiver will verbalize understanding of skin care regimen Date Initiated: 03/06/2020 Target Resolution Date: 04/06/2020 Goal Status: Active Interventions: Assess patient/caregiver ability to obtain necessary supplies Assess patient/caregiver ability to perform ulcer/skin care regimen upon admission and as needed Assess ulceration(s) every visit Provide education on smoking Provide education on ulcer and skin care Notes: Electronic Signature(s) Signed: 03/06/2020 5:24:10 PM By: Zandra Abts RN, BSN Entered By: Zandra Abts on 03/06/2020 15:57:53 -------------------------------------------------------------------------------- Pain Assessment Details Patient Name: Date of Service: Raymond Clara NE, [name] 03/05/2020 1:15 PM Medical Record Number: [id number] Patient Account Number: [id number] Date of Birth/Sex: Treating  RN: [birth year] (48 y.o. Male) Yevonne Pax Primary Care Brennen Camper: Bertram Denver Other Clinician: Referring Parnell Spieler: Treating Mekesha Solomon/Extender: Neale Burly in Treatment: 0 Active Problems Location of Pain Severity and Description of Pain Patient Has Paino Yes Site Locations With Dressing Change: Yes Duration of the Pain. Constant / Intermittento Constant Rate the pain. Current Pain Level: 4 Worst Pain Level: 10 Least Pain Level: 2 Tolerable Pain Level: 4 Character  of Pain Describe the Pain: Aching, Burning Pain Management and Medication Current Pain Management: Medication: Yes Cold Application: No Rest: Yes Massage: No Activity: No T.E.N.S.: No Heat Application: No Leg drop or elevation: No Is the Current Pain Management Adequate: Inadequate How does your wound impact your activities of daily livingo Sleep: Yes Bathing: No Appetite: No Relationship With Others: No Bladder Continence: No Emotions: No Bowel Continence: No Work: No Toileting: No Drive: No Dressing: No Hobbies: No Electronic Signature(s) Signed: 03/06/2020 4:37:04 PM By: Yevonne Pax RN Entered By: Yevonne Pax on 03/06/2020 09:07:56 -------------------------------------------------------------------------------- Patient/Caregiver Education Details Patient Name: Date of Service: Raymond Craven, [name] 12/13/2021andnbsp1:15 PM Medical Record Number: [id number] Patient Account Number: [id number] Date of Birth/Gender: Treating RN: 1970/11/07 (49 y.o. Male) Zandra Abts Primary Care Physician: Bertram Denver Other Clinician: Referring Physician: Treating Physician/Extender: Neale Burly in Treatment: 0 Education Assessment Education Provided To: Patient Education Topics Provided Smoking and Wound Healing: Methods: Explain/Verbal Responses: State content correctly Venous: Methods: Explain/Verbal Responses: State content correctly Wound/Skin  Impairment: Methods: Explain/Verbal Responses: State content correctly Electronic Signature(s) Signed: 03/06/2020 5:24:10 PM By: Zandra Abts RN, BSN Entered By: Zandra Abts on 03/06/2020 15:58:06 -------------------------------------------------------------------------------- Wound Assessment Details Patient Name: Date of Service: Raymond Clara [place], [name] 03/05/2020 1:15 PM Medical Record Number: [id number] Patient Account Number: [id number] Date of Birth/Sex: Treating RN: May 28, 1970 (48 y.o. Male) Zenaida Deed Primary Care Kwadwo Taras: Bertram Denver Other Clinician: Referring Blessed Cotham: Treating Rosea Dory/Extender: Neale Burly in Treatment: 0 Wound Status Wound Number: 1 Primary Etiology: Venous Leg Ulcer Wound Location: Left, Proximal, Anterior Lower Leg Wound Status: Open Wounding Event: Gradually Appeared Comorbid History: Asthma, Hypertension Date Acquired: 07/28/2018 Weeks Of Treatment: 0 Clustered Wound: No Wound Measurements Length: (cm) 4 Width: (cm) 4.5 Depth: (cm) 0.2 Area: (cm) 14.137 Volume: (cm) 2.827 % Reduction in Area: 0% % Reduction in Volume: 0% Epithelialization: None Tunneling: No Undermining: No Wound Description Classification: Full Thickness Without Exposed Support Structures Wound Margin: Flat and Intact Exudate Amount: Medium Exudate Type: Serosanguineous Exudate Color: red, brown Foul Odor After Cleansing: No Slough/Fibrino Yes Wound Bed Granulation Amount: Medium (34-66%) Exposed Structure Granulation Quality: Red Fascia Exposed: No Necrotic Amount: Medium (34-66%) Fat Layer (Subcutaneous Tissue) Exposed: Yes Necrotic Quality: Adherent Slough Tendon Exposed: No Muscle Exposed: No Joint Exposed: No Bone Exposed: No Electronic Signature(s) Signed: 03/06/2020 4:37:04 PM By: Yevonne Pax RN Signed: 03/06/2020 4:55:24 PM By: Zenaida Deed RN, BSN Entered By: Yevonne Pax on 03/06/2020  08:53:52 -------------------------------------------------------------------------------- Wound Assessment Details Patient Name: Date of Service: Raymond Clara [place], [name] 03/05/2020 1:15 PM Medical Record Number: [id number] Patient Account Number: [id number] Date of Birth/Sex: Treating RN: 1970/08/13 (48 y.o. Male) Zenaida Deed Primary Care Ixel Boehning: Bertram Denver Other Clinician: Referring Jelesa Mangini: Treating Dontrelle Mazon/Extender: Neale Burly in Treatment: 0 Wound Status Wound Number: 2 Primary Etiology: Venous Leg Ulcer Wound Location: Left, Proximal, Lateral Lower Leg Wound Status: Open Wounding Event: Gradually Appeared Comorbid History: Asthma, Hypertension Date Acquired: 07/28/2018 Weeks Of Treatment: 0 Clustered Wound: No Wound Measurements Length: (cm) 2.3 Width: (cm) 3 Depth: (cm) 0.2 Area: (cm) 5.419 Volume: (cm) 1.084 % Reduction in Area: 0% % Reduction in Volume: 0% Epithelialization: None Tunneling: No Undermining: No Wound Description Classification: Full Thickness Without Exposed Support Structu Exudate Amount: Medium Exudate Type: Serosanguineous Exudate Color: red, brown res Foul Odor After Cleansing: No Slough/Fibrino Yes Wound Bed Granulation Amount: Medium (34-66%) Exposed Structure Granulation Quality: Red Fascia Exposed: No Necrotic Amount: Medium (34-66%) Fat  Layer (Subcutaneous Tissue) Exposed: Yes Necrotic Quality: Adherent Slough Tendon Exposed: No Muscle Exposed: No Joint Exposed: No Bone Exposed: No Electronic Signature(s) Signed: 03/06/2020 4:37:04 PM By: Yevonne Pax RN Signed: 03/06/2020 4:55:24 PM By: Zenaida Deed RN, BSN Entered By: Yevonne Pax on 03/06/2020 08:54:15 -------------------------------------------------------------------------------- Wound Assessment Details Patient Name: Date of Service: Raymond Clara NE, [name] 03/05/2020 1:15 PM Medical Record Number: [id number] Patient Account Number:  [id number] Date of Birth/Sex: Treating RN: 10-07-1970 (48 y.o. Male) Yevonne Pax Primary Care Khiara Shuping: Bertram Denver Other Clinician: Referring Laquanda Bick: Treating Thandiwe Siragusa/Extender: Davene Costain, Fredia Beets in Treatment: 0 Wound Status Wound Number: 3 Primary Etiology: Venous Leg Ulcer Wound Location: Left, Anterior Lower Leg Wound Status: Open Wounding Event: Trauma Comorbid History: Asthma, Hypertension Date Acquired: 07/28/2018 Weeks Of Treatment: 0 Clustered Wound: No Wound Measurements Length: (cm) 3 Width: (cm) 2.4 Depth: (cm) 0.2 Area: (cm) 5.655 Volume: (cm) 1.131 % Reduction in Area: % Reduction in Volume: Epithelialization: None Tunneling: No Undermining: No Wound Description Classification: Full Thickness Without Exposed Support Structures Exudate Amount: Medium Exudate Type: Serosanguineous Exudate Color: red, brown Foul Odor After Cleansing: No Slough/Fibrino Yes Wound Bed Granulation Amount: Medium (34-66%) Exposed Structure Granulation Quality: Red, Pink, Pale Fascia Exposed: No Necrotic Amount: Medium (34-66%) Fat Layer (Subcutaneous Tissue) Exposed: No Necrotic Quality: Adherent Slough Tendon Exposed: No Muscle Exposed: No Joint Exposed: No Bone Exposed: No Treatment Notes Wound #3 (Lower Leg) Wound Laterality: Left, Anterior Cleanser Soap and Water Discharge Instruction: May shower and wash wound with dial antibacterial soap and water prior to dressing change. Wound Cleanser Discharge Instruction: Cleanse the wound with wound cleanser prior to applying a clean dressing using gauze sponges, not tissue or cotton balls. Peri-Wound Care Triamcinolone 15 (g) Discharge Instruction: Use triamcinolone 15 (g) as directed Sween Lotion (Moisturizing lotion) Discharge Instruction: Apply moisturizing lotion as directed Topical Primary Dressing IODOFLEX 0.9% Cadexomer Iodine Pad 4x6 cm Discharge Instruction: Apply to wound bed as  instructed Secondary Dressing ABD Pad, 8x10 Discharge Instruction: Apply over primary dressing as directed. Zetuvit Plus 4x8 in Discharge Instruction: Apply over primary dressing as directed. Secured With Compression Wrap FourPress (4 layer compression wrap) Discharge Instruction: Apply four layer compression as directed. Compression Stockings Add-Ons Electronic Signature(s) Signed: 03/06/2020 4:37:04 PM By: Yevonne Pax RN Entered By: Yevonne Pax on 03/06/2020 08:52:38 -------------------------------------------------------------------------------- Wound Assessment Details Patient Name: Date of Service: Raymond Clara NE, [name] 03/05/2020 1:15 PM Medical Record Number: [id number] Patient Account Number: [id number] Date of Birth/Sex: Treating RN: Jul 23, 1970 (48 y.o. Male) Yevonne Pax Primary Care Amariana Mirando: Bertram Denver Other Clinician: Referring Shelly Spenser: Treating Diannah Rindfleisch/Extender: Davene Costain, Fredia Beets in Treatment: 0 Wound Status Wound Number: 4 Primary Etiology: Venous Leg Ulcer Wound Location: Left, Distal, Anterior Lower Leg Wound Status: Open Wounding Event: Trauma Comorbid History: Asthma, Hypertension Date Acquired: 07/28/2018 Weeks Of Treatment: 0 Clustered Wound: No Wound Measurements Length: (cm) 3 Width: (cm) 2.5 Depth: (cm) 0.1 Area: (cm) 5.89 Volume: (cm) 0.589 % Reduction in Area: % Reduction in Volume: Epithelialization: None Tunneling: No Undermining: No Wound Description Classification: Full Thickness Without Exposed Support Structures Exudate Amount: Medium Exudate Type: Serosanguineous Exudate Color: red, brown Foul Odor After Cleansing: No Slough/Fibrino Yes Wound Bed Granulation Amount: Medium (34-66%) Exposed Structure Granulation Quality: Red, Pink, Pale Fascia Exposed: No Necrotic Amount: Medium (34-66%) Fat Layer (Subcutaneous Tissue) Exposed: Yes Necrotic Quality: Adherent Slough Tendon Exposed: No Muscle  Exposed: No Joint Exposed: No Bone Exposed: No Treatment Notes Wound #4 (Lower Leg) Wound Laterality: Left, Anterior,  Distal Cleanser Soap and Water Discharge Instruction: May shower and wash wound with dial antibacterial soap and water prior to dressing change. Wound Cleanser Discharge Instruction: Cleanse the wound with wound cleanser prior to applying a clean dressing using gauze sponges, not tissue or cotton balls. Peri-Wound Care Triamcinolone 15 (g) Discharge Instruction: Use triamcinolone 15 (g) as directed Sween Lotion (Moisturizing lotion) Discharge Instruction: Apply moisturizing lotion as directed Topical Primary Dressing IODOFLEX 0.9% Cadexomer Iodine Pad 4x6 cm Discharge Instruction: Apply to wound bed as instructed Secondary Dressing ABD Pad, 8x10 Discharge Instruction: Apply over primary dressing as directed. Zetuvit Plus 4x8 in Discharge Instruction: Apply over primary dressing as directed. Secured With Compression Wrap FourPress (4 layer compression wrap) Discharge Instruction: Apply four layer compression as directed. Compression Stockings Add-Ons Electronic Signature(s) Signed: 03/06/2020 4:37:04 PM By: Yevonne PaxEpps, Carrie RN Entered By: Yevonne PaxEpps, Carrie on 03/06/2020 08:56:31 -------------------------------------------------------------------------------- Wound Assessment Details Patient Name: Date of Service: Raymond Gonzalez O NE, [name] 03/05/2020 1:15 PM Medical Record Number: [id number] Patient Account Number: [id number] Date of Birth/Sex: Treating RN: 05/30/1970 (48 y.o. Male) Yevonne PaxEpps, Carrie Primary Care Shayle Donahoo: Bertram DenverFleming, Zelda Other Clinician: Referring Sharise Lippy: Treating Beckett Hickmon/Extender: Davene Costainobson, Michael Fleming, Fredia BeetsZelda Weeks in Treatment: 0 Wound Status Wound Number: 5 Primary Etiology: Venous Leg Ulcer Wound Location: Left, Distal, Lateral Lower Leg Wound Status: Open Wounding Event: Trauma Comorbid History: Asthma, Hypertension Date Acquired:  07/28/2018 Weeks Of Treatment: 0 Clustered Wound: No Wound Measurements Length: (cm) 4 Width: (cm) 2.5 Depth: (cm) 0.1 Area: (cm) 7.854 Volume: (cm) 0.785 % Reduction in Area: % Reduction in Volume: Epithelialization: None Tunneling: No Undermining: No Wound Description Classification: Full Thickness Without Exposed Support Structures Exudate Amount: Medium Exudate Type: Serosanguineous Exudate Color: red, brown Foul Odor After Cleansing: No Slough/Fibrino Yes Wound Bed Granulation Amount: Medium (34-66%) Exposed Structure Granulation Quality: Red, Pink, Pale Fascia Exposed: No Necrotic Amount: Medium (34-66%) Fat Layer (Subcutaneous Tissue) Exposed: Yes Necrotic Quality: Adherent Slough Tendon Exposed: No Muscle Exposed: No Joint Exposed: No Bone Exposed: No Treatment Notes Wound #5 (Lower Leg) Wound Laterality: Left, Lateral, Distal Cleanser Soap and Water Discharge Instruction: May shower and wash wound with dial antibacterial soap and water prior to dressing change. Wound Cleanser Discharge Instruction: Cleanse the wound with wound cleanser prior to applying a clean dressing using gauze sponges, not tissue or cotton balls. Peri-Wound Care Triamcinolone 15 (g) Discharge Instruction: Use triamcinolone 15 (g) as directed Sween Lotion (Moisturizing lotion) Discharge Instruction: Apply moisturizing lotion as directed Topical Primary Dressing IODOFLEX 0.9% Cadexomer Iodine Pad 4x6 cm Discharge Instruction: Apply to wound bed as instructed Secondary Dressing ABD Pad, 8x10 Discharge Instruction: Apply over primary dressing as directed. Zetuvit Plus 4x8 in Discharge Instruction: Apply over primary dressing as directed. Secured With Compression Wrap FourPress (4 layer compression wrap) Discharge Instruction: Apply four layer compression as directed. Compression Stockings Add-Ons Electronic Signature(s) Signed: 03/06/2020 4:37:04 PM By: Yevonne PaxEpps, Carrie RN Entered  By: Yevonne PaxEpps, Carrie on 03/06/2020 08:59:05 -------------------------------------------------------------------------------- Wound Assessment Details Patient Name: Date of Service: Raymond Gonzalez O NE, [name] 03/05/2020 1:15 PM Medical Record Number: [id number] Patient Account Number: [id number] Date of Birth/Sex: Treating RN: 05/30/1970 (48 y.o. Male) Yevonne PaxEpps, Carrie Primary Care Romeka Scifres: Bertram DenverFleming, Zelda Other Clinician: Referring Johntavious Francom: Treating Geryl Dohn/Extender: Davene Costainobson, Michael Fleming, Fredia BeetsZelda Weeks in Treatment: 0 Wound Status Wound Number: 6 Primary Etiology: Venous Leg Ulcer Wound Location: Left, Distal, Medial Lower Leg Wound Status: Open Wounding Event: Trauma Comorbid History: Asthma, Hypertension Date Acquired: 07/28/2018 Weeks Of Treatment: 0 Clustered Wound: No Wound Measurements Length: (cm) 2  Width: (cm) 1.5 Depth: (cm) 0.1 Area: (cm) 2.356 Volume: (cm) 0.236 % Reduction in Area: % Reduction in Volume: Epithelialization: None Tunneling: No Undermining: No Wound Description Classification: Full Thickness Without Exposed Support Structures Exudate Amount: Medium Exudate Type: Serosanguineous Exudate Color: red, brown Foul Odor After Cleansing: No Slough/Fibrino Yes Wound Bed Granulation Amount: Medium (34-66%) Exposed Structure Granulation Quality: Red, Pink Fascia Exposed: No Necrotic Amount: Medium (34-66%) Fat Layer (Subcutaneous Tissue) Exposed: Yes Necrotic Quality: Adherent Slough Tendon Exposed: No Muscle Exposed: No Joint Exposed: No Bone Exposed: No Treatment Notes Wound #6 (Lower Leg) Wound Laterality: Left, Medial, Distal Cleanser Soap and Water Discharge Instruction: May shower and wash wound with dial antibacterial soap and water prior to dressing change. Wound Cleanser Discharge Instruction: Cleanse the wound with wound cleanser prior to applying a clean dressing using gauze sponges, not tissue or cotton balls. Peri-Wound Care Triamcinolone  15 (g) Discharge Instruction: Use triamcinolone 15 (g) as directed Sween Lotion (Moisturizing lotion) Discharge Instruction: Apply moisturizing lotion as directed Topical Primary Dressing IODOFLEX 0.9% Cadexomer Iodine Pad 4x6 cm Discharge Instruction: Apply to wound bed as instructed Secondary Dressing ABD Pad, 8x10 Discharge Instruction: Apply over primary dressing as directed. Zetuvit Plus 4x8 in Discharge Instruction: Apply over primary dressing as directed. Secured With Compression Wrap FourPress (4 layer compression wrap) Discharge Instruction: Apply four layer compression as directed. Compression Stockings Add-Ons Electronic Signature(s) Signed: 03/06/2020 4:37:04 PM By: Yevonne Pax RN Entered By: Yevonne Pax on 03/06/2020 09:00:25 -------------------------------------------------------------------------------- Vitals Details Patient Name: Date of Service: Raymond Clara NE, [name] 03/05/2020 1:15 PM Medical Record Number: [id number] Patient Account Number: [id number] Date of Birth/Sex: Treating RN: 03-30-1970 (48 y.o. Male) Zenaida Deed Primary Care Tayton Decaire: Bertram Denver Other Clinician: Referring Briannon Boggio: Treating Kristien Salatino/Extender: Neale Burly in Treatment: 0 Vital Signs Time Taken: 13:30 Temperature (F): 98.1 Height (in): 73 Pulse (bpm): 82 Source: Stated Respiratory Rate (breaths/min): 18 Weight (lbs): 280 Blood Pressure (mmHg): 147/97 Source: Stated Reference Range: 80 - 120 mg / dl Body Mass Index (BMI): 36.9 Electronic Signature(s) Signed: 03/06/2020 4:37:04 PM By: Yevonne Pax RN Entered By: Yevonne Pax on 03/06/2020 08:38:40

## 2020-03-06 NOTE — Progress Notes (Signed)
Raymond Gonzalez ([id number]) Visit Report for 03/05/2020 Abuse/Suicide Risk Screen Details Patient Name: Date of Service: Raymond Gonzalez, [name] TTHEW 03/05/2020 1:15 PM Medical Record Number: [id number] Patient Account Number: [id number] Date of Birth/Sex: Treating RN: [birth year] (49 y.o. Male) Yevonne Pax Primary Care Collin Hendley: Bertram Denver Other Clinician: Referring Kenyah Luba: Treating Raymond Gonzalez/Extender: Davene Costain, Fredia Beets in Treatment: 0 Abuse/Suicide Risk Screen Items Answer ABUSE RISK SCREEN: Has anyone close to you tried to hurt or harm you recentlyo No Do you feel uncomfortable with anyone in your familyo No Has anyone forced you do things that you didnt want to doo No Electronic Signature(s) Signed: 03/06/2020 4:37:04 PM By: Yevonne Pax RN Entered By: Yevonne Pax on 03/06/2020 08:45:31 -------------------------------------------------------------------------------- Activities of Daily Living Details Patient Name: Date of Service: Raymond Gonzalez, [name] TTHEW 03/05/2020 1:15 PM Medical Record Number: [id number] Patient Account Number: [id number] Date of Birth/Sex: Treating RN: [birth year] (49 y.o. Male) Yevonne Pax Primary Care Chevella Pearce: Bertram Denver Other Clinician: Referring Jenesis Suchy: Treating Spence Soberano/Extender: Davene Costain, Fredia Beets in Treatment: 0 Activities of Daily Living Items Answer Activities of Daily Living (Please select one for each item) Drive Automobile Completely Able T Medications ake Completely Able Use T elephone Completely Able Care for Appearance Completely Able Use T oilet Completely Able Bath / Shower Completely Able Dress Self Completely Able Feed Self Completely Able Walk Completely Able Get In / Out Bed Completely Able Housework Completely Able Prepare Meals Completely Able Handle Money Completely Able Shop for Self Completely Able Electronic Signature(s) Signed: 03/06/2020 4:37:04 PM By: Yevonne Pax RN Entered By:  Yevonne Pax on 03/06/2020 08:45:51 -------------------------------------------------------------------------------- Education Screening Details Patient Name: Date of Service: Raymond Clara NE, [name] 03/05/2020 1:15 PM Medical Record Number: [id number] Patient Account Number: [id number] Date of Birth/Sex: Treating RN: Mar 01, 1971 (49 y.o. Male) Yevonne Pax Primary Care Raymond Gonzalez: Bertram Denver Other Clinician: Referring Dorthy Hustead: Treating Carrina Schoenberger/Extender: Neale Burly in Treatment: 0 Learning Preferences/Education Level/Primary Language Learning Preference: Explanation Highest Education Level: College or Above Preferred Language: English Cognitive Barrier Language Barrier: No Translator Needed: No Memory Deficit: No Emotional Barrier: No Cultural/Religious Beliefs Affecting Medical Care: No Physical Barrier Impaired Vision: No Impaired Hearing: No Decreased Hand dexterity: No Knowledge/Comprehension Knowledge Level: Medium Comprehension Level: High Ability to understand written instructions: High Ability to understand verbal instructions: High Motivation Anxiety Level: Anxious Cooperation: Cooperative Education Importance: Acknowledges Need Interest in Health Problems: Asks Questions Perception: Coherent Willingness to Engage in Self-Management High Activities: Readiness to Engage in Self-Management High Activities: Electronic Signature(s) Signed: 03/06/2020 4:37:04 PM By: Yevonne Pax RN Entered By: Yevonne Pax on 03/06/2020 08:46:15 -------------------------------------------------------------------------------- Fall Risk Assessment Details Patient Name: Date of Service: Raymond Clara NE, [name] 03/05/2020 1:15 PM Medical Record Number: [id number] Patient Account Number: [id number] Date of Birth/Sex: Treating RN: 01/02/1971 (49 y.o. Male) Yevonne Pax Primary Care Kollyn Lingafelter: Bertram Denver Other Clinician: Referring Tzion Wedel: Treating  Niels Cranshaw/Extender: Davene Costain, Fredia Beets in Treatment: 0 Fall Risk Assessment Items Have you had 2 or more falls in the last 12 monthso 0 No Have you had any fall that resulted in injury in the last 12 monthso 0 No FALLS RISK SCREEN History of falling - immediate or within 3 months 0 No Secondary diagnosis (Do you have 2 or more medical diagnoseso) 0 No Ambulatory aid None/bed rest/wheelchair/nurse 0 No Crutches/cane/walker 0 No Furniture 0 No Intravenous therapy Access/Saline/Heparin Lock 0 No Gait/Transferring Normal/ bed rest/ wheelchair 0 No Weak (short steps with or without shuffle, stooped but able  to lift head while walking, may seek 0 No support from furniture) Impaired (short steps with shuffle, may have difficulty arising from chair, head down, impaired 0 No balance) Mental Status Oriented to own ability 0 No Electronic Signature(s) Signed: 03/06/2020 4:37:04 PM By: Yevonne Pax RN Entered By: Yevonne Pax on 03/06/2020 08:46:21 -------------------------------------------------------------------------------- Foot Assessment Details Patient Name: Date of Service: Raymond Clara NE, [name] 03/05/2020 1:15 PM Medical Record Number: [id number] Patient Account Number: [id number] Date of Birth/Sex: Treating RN: May 19, 1970 (49 y.o. Male) Yevonne Pax Primary Care Tim Wilhide: Bertram Denver Other Clinician: Referring Fardeen Steinberger: Treating Taytum Scheck/Extender: Davene Costain, Fredia Beets in Treatment: 0 Foot Assessment Items Site Locations + = Sensation present, - = Sensation absent, C = Callus, U = Ulcer R = Redness, W = Warmth, M = Maceration, PU = Pre-ulcerative lesion F = Fissure, S = Swelling, D = Dryness Assessment Right: Left: Other Deformity: No No Prior Foot Ulcer: No No Prior Amputation: No No Charcot Joint: No No Ambulatory Status: Ambulatory Without Help Gait: Steady Electronic Signature(s) Signed: 03/06/2020 4:37:04 PM By: Yevonne Pax  RN Entered By: Yevonne Pax on 03/06/2020 08:46:59 -------------------------------------------------------------------------------- Nutrition Risk Screening Details Patient Name: Date of ServiceOlene Gonzalez, [name] TTHEW 03/05/2020 1:15 PM Medical Record Number: [id number] Patient Account Number: [id number] Date of Birth/Sex: Treating RN: [birth year] (48 y.o. Male) Yevonne Pax Primary Care Simranjit Thayer: Bertram Denver Other Clinician: Referring Collins Dimaria: Treating Zariel Capano/Extender: Davene Costain, Fredia Beets in Treatment: 0 Height (in): 73 Weight (lbs): 280 Body Mass Index (BMI): 36.9 Nutrition Risk Screening Items Score Screening NUTRITION RISK SCREEN: I have an illness or condition that made me change the kind and/or amount of food I eat 0 No I eat fewer than two meals per day 0 No I eat few fruits and vegetables, or milk products 0 No I have three or more drinks of beer, liquor or wine almost every day 0 No I have tooth or mouth problems that make it hard for me to eat 0 No I don't always have enough money to buy the food I need 0 No I eat alone most of the time 0 No I take three or more different prescribed or over-the-counter drugs a day 0 No Without wanting to, I have lost or gained 10 pounds in the last six months 0 No I am not always physically able to shop, cook and/or feed myself 0 No Nutrition Protocols Good Risk Protocol 0 No interventions needed Moderate Risk Protocol High Risk Proctocol Risk Level: Good Risk Score: 0 Electronic Signature(s) Signed: 03/06/2020 4:37:04 PM By: Yevonne Pax RN Entered By: Yevonne Pax on 03/06/2020 08:46:31
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
## 2020-03-12 ENCOUNTER — Encounter (HOSPITAL_BASED_OUTPATIENT_CLINIC_OR_DEPARTMENT_OTHER): Payer: Medicaid Other | Admitting: Internal Medicine

## 2020-03-12 ENCOUNTER — Other Ambulatory Visit: Payer: Self-pay

## 2020-03-12 NOTE — Progress Notes (Signed)
Raymond, Gonzalez ([id number]) Visit Report for 03/12/2020 Arrival Information Details Patient Name: Date of Service: Raymond Gonzalez [name], [name] TTHEW 03/12/2020 3:00 PM Medical Record Number: [id number] Patient Account Number: [id number] Date of Birth/Sex: Treating RN: 1970-06-16 (49 y.o. Raymond Gonzalez) Yevonne Pax Primary Care Raymond Gonzalez: Bertram Denver Other Clinician: Referring Sven Pinheiro: Treating Galvin Aversa/Extender: Neale Burly in Treatment: 1 Visit Information History Since Last Visit All ordered tests and consults were completed: No Patient Arrived: Ambulatory Added or deleted any medications: No Arrival Time: 15:51 Any new allergies or adverse reactions: No Accompanied By: self Had a fall or experienced change in No Transfer Assistance: None activities of daily living that may affect Patient Identification Verified: Yes risk of falls: Secondary Verification Process Completed: Yes Signs or symptoms of abuse/neglect since last visito No Patient Requires Transmission-Based Precautions: No Hospitalized since last visit: No Patient Has Alerts: No Implantable device outside of the clinic excluding No cellular tissue based products placed in the center since last visit: Has Dressing in Place as Prescribed: Yes Has Compression in Place as Prescribed: Yes Pain Present Now: Yes Electronic Signature(s) Signed: 03/12/2020 5:24:04 PM By: Yevonne Pax RN Entered By: Yevonne Pax on 03/12/2020 15:51:27 -------------------------------------------------------------------------------- Compression Therapy Details Patient Name: Date of Service: Raymond [place], [name] 03/12/2020 3:00 PM Medical Record Number: [id number] Patient Account Number: [id number] Date of Birth/Sex: Treating RN: [birth year] (49 y.o. Raymond Gonzalez Primary Care Flavius Repsher: Bertram Denver Other Clinician: Referring Ethelwyn Gilbertson: Treating Christel Bai/Extender: Neale Burly in Treatment: 1 Compression  Therapy Performed for Wound Assessment: Wound #1 Left,Proximal,Anterior Lower Leg Performed By: Clinician Zandra Abts, RN Compression Type: Three Layer Post Procedure Diagnosis Same as Pre-procedure Electronic Signature(s) Signed: 03/12/2020 5:38:15 PM By: Zandra Abts RN, BSN Entered By: Zandra Abts on 03/12/2020 16:19:12 -------------------------------------------------------------------------------- Compression Therapy Details Patient Name: Date of Service: Raymond [name] 03/12/2020 3:00 PM Medical Record Number: [id number] Patient Account Number: [id number] Date of Birth/Sex: Treating RN: 07-Sep-1970 (49 y.o. Raymond Gonzalez Primary Care Nikeshia Keetch: Bertram Denver Other Clinician: Referring Lymon Kidney: Treating Aqil Goetting/Extender: Neale Burly in Treatment: 1 Compression Therapy Performed for Wound Assessment: Wound #2 Left,Proximal,Lateral Lower Leg Performed By: Clinician Zandra Abts, RN Compression Type: Three Layer Post Procedure Diagnosis Same as Pre-procedure Electronic Signature(s) Signed: 03/12/2020 5:38:15 PM By: Zandra Abts RN, BSN Entered By: Zandra Abts on 03/12/2020 16:19:13 -------------------------------------------------------------------------------- Compression Therapy Details Patient Name: Date of Service: Raymond [place], [name] 03/12/2020 3:00 PM Medical Record Number: [id number] Patient Account Number: [id number] Date of Birth/Sex: Treating RN: 27-Oct-1970 (49 y.o. Raymond Gonzalez Primary Care Sarahbeth Cashin: Bertram Denver Other Clinician: Referring Isatou Agredano: Treating Sharleen Szczesny/Extender: Neale Burly in Treatment: 1 Compression Therapy Performed for Wound Assessment: Wound #3 Left,Anterior Lower Leg Performed By: Clinician Zandra Abts, RN Compression Type: Three Layer Post Procedure Diagnosis Same as Pre-procedure Electronic Signature(s) Signed: 03/12/2020 5:38:15 PM By: Zandra Abts  RN, BSN Entered By: Zandra Abts on 03/12/2020 16:19:13 -------------------------------------------------------------------------------- Compression Therapy Details Patient Name: Date of Service: Raymond [place], [name] 03/12/2020 3:00 PM Medical Record Number: [id number] Patient Account Number: [id number] Date of Birth/Sex: Treating RN: [birth year] (49 y.o. Raymond Gonzalez Primary Care Sherida Dobkins: Bertram Denver Other Clinician: Referring Athelene Hursey: Treating Chi Garlow/Extender: Neale Burly in Treatment: 1 Compression Therapy Performed for Wound Assessment: Wound #4 Left,Distal,Anterior Lower Leg Performed By: Clinician Zandra Abts, RN Compression Type: Three Layer Post Procedure Diagnosis Same as Pre-procedure Electronic Signature(s) Signed: 03/12/2020 5:38:15 PM By: Zandra Abts RN, BSN Entered  By: Zandra Abts on 03/12/2020 16:19:13 -------------------------------------------------------------------------------- Compression Therapy Details Patient Name: Date of Service: Raymond Gonzalez, [name] TTHEW 03/12/2020 3:00 PM Medical Record Number: [id number] Patient Account Number: [id number] Date of Birth/Sex: Treating RN: 11-24-1970 (49 y.o. Raymond Gonzalez Primary Care Terril Chestnut: Bertram Denver Other Clinician: Referring Sophiah Rolin: Treating Domnic Vantol/Extender: Neale Burly in Treatment: 1 Compression Therapy Performed for Wound Assessment: Wound #5 Left,Distal,Lateral Lower Leg Performed By: Clinician Zandra Abts, RN Compression Type: Three Layer Post Procedure Diagnosis Same as Pre-procedure Electronic Signature(s) Signed: 03/12/2020 5:38:15 PM By: Zandra Abts RN, BSN Entered By: Zandra Abts on 03/12/2020 16:19:14 -------------------------------------------------------------------------------- Compression Therapy Details Patient Name: Date of Service: Raymond [name] 03/12/2020 3:00 PM Medical Record Number:  [id number] Patient Account Number: [id number] Date of Birth/Sex: Treating RN: 11-02-1970 (49 y.o. Raymond Gonzalez Primary Care Concetta Guion: Bertram Denver Other Clinician: Referring Gabrian Hoque: Treating Jaquaya Coyle/Extender: Neale Burly in Treatment: 1 Compression Therapy Performed for Wound Assessment: Wound #6 Left,Distal,Medial Lower Leg Performed By: Clinician Zandra Abts, RN Compression Type: Three Layer Post Procedure Diagnosis Same as Pre-procedure Electronic Signature(s) Signed: 03/12/2020 5:38:15 PM By: Zandra Abts RN, BSN Entered By: Zandra Abts on 03/12/2020 16:19:14 -------------------------------------------------------------------------------- Encounter Discharge Information Details Patient Name: Date of Service: Raymond [place], [name] 03/12/2020 3:00 PM Medical Record Number: [id number] Patient Account Number: [id number] Date of Birth/Sex: Treating RN: 03/13/1971 (49 y.o. [name] Primary Care Ranita Stjulien: Other Clinician: Bertram [place] Referring Johnmark Geiger: Treating Summit Borchardt/Extender: Neale Burly in Treatment: 1 Encounter Discharge Information Items Discharge Condition: Stable Ambulatory Status: Ambulatory Discharge Destination: Home Transportation: Private Auto Accompanied By: self Schedule Follow-up Appointment: Yes Clinical Summary of Care: Electronic Signature(s) Signed: 03/12/2020 5:23:06 PM By: Shawn Stall Entered By: Shawn Stall on 03/12/2020 17:19:31 -------------------------------------------------------------------------------- Lower Extremity Assessment Details Patient Name: Date of Service: Raymond [place], [name] TTHEW 03/12/2020 3:00 PM Medical Record Number: [id number] Patient Account Number: [id number] Date of Birth/Sex: Treating RN: 28-Jun-1970 (49 y.o. Melonie [place] Primary Care Delecia Vastine: Bertram Denver Other Clinician: Referring Shamyia Grandpre: Treating Xoe Hoe/Extender: Davene Costain, Shon Hale Weeks in Treatment: 1 Edema Assessment Assessed: [Left: No] [Right: No] E[Left: dema] [Right: :] Calf Left: Right: Point of Measurement: 46 cm From Medial Instep 48 cm 44 cm Ankle Left: Right: Point of Measurement: 11 cm From Medial Instep 28 cm 2 cm Vascular Assessment Blood Pressure: Brachial: [Left:171] [Right:171] Ankle: [Left:Dorsalis Pedis: 160 0.94] [Right:Dorsalis Pedis: 180 1.05] Electronic Signature(s) Signed: 03/12/2020 5:24:04 PM By: Yevonne Pax RN Entered By: Yevonne Pax on 03/12/2020 15:54:47 -------------------------------------------------------------------------------- Multi Wound Chart Details Patient Name: Date of Service: Raymond [name] 03/12/2020 3:00 PM Medical Record Number: [id number] Patient Account Number: [id number] Date of Birth/Sex: Treating RN: September 15, 1970 (49 y.o. Raymond Gonzalez Primary Care Larene Ascencio: Bertram Denver Other Clinician: Referring Olufemi Mofield: Treating Avryl Roehm/Extender: Davene Costain, Fredia Beets in Treatment: 1 Vital Signs Height(in): 73 Pulse(bpm): 91 Weight(lbs): 280 Blood Pressure(mmHg): 171/104 Body Mass Index(BMI): 37 Temperature(F): 98.4 Respiratory Rate(breaths/min): 18 Photos: [1:No Photos Left, Proximal, Anterior Lower Leg] [2:No Photos Left, Proximal, Lateral Lower Leg] [3:No Photos Left, Anterior Lower Leg] Wound Location: [1:Trauma] [2:Trauma] [3:Trauma] Wounding Event: [1:Venous Leg Ulcer] [2:Venous Leg Ulcer] [3:Venous Leg Ulcer] Primary Etiology: [1:Asthma, Hypertension] [2:Asthma, Hypertension] [3:Asthma, Hypertension] Comorbid History: [1:07/28/2018] [2:07/28/2018] [3:07/28/2018] Date Acquired: [1:1] [2:1] [3:1] Weeks of Treatment: [1:Open] [2:Open] [3:Open] Wound Status: [1:4x5x0.1] [2:3x2.5x0.2] [3:3x2.5x0.1] Measurements L x W x D (cm) [1:15.708] [2:5.89] [3:5.89] A (cm) : rea [1:1.571] [2:1.178] [3:0.589] Volume (cm) : [1:-11.10%] [  2:-8.70%] [3:-4.20%] %  Reduction in A rea: [1:44.40%] [2:-8.70%] [3:47.90%] % Reduction in Volume: [1:Full Thickness Without Exposed] [2:Full Thickness Without Exposed] [3:Full Thickness Without Exposed] Classification: [1:Support Structures Medium] [2:Support Structures Medium] [3:Support Structures Medium] Exudate Amount: [1:Serosanguineous] [2:Serosanguineous] [3:Serosanguineous] Exudate Type: [1:red, brown] [2:red, brown] [3:red, brown] Exudate Color: [1:Flat and Intact] [2:N/A] [3:N/A] Wound Margin: [1:Medium (34-66%)] [2:Medium (34-66%)] [3:Medium (34-66%)] Granulation Amount: [1:Red] [2:Red] [3:Red, Pink, Pale] Granulation Quality: [1:Medium (34-66%)] [2:Medium (34-66%)] [3:Medium (34-66%)] Necrotic Amount: [1:Fat Layer (Subcutaneous Tissue): Yes Fat Layer (Subcutaneous Tissue): Yes Fascia: No] Exposed Structures: [1:Fascia: No Tendon: No Muscle: No Joint: No Bone: No None] [2:Fascia: No Tendon: No Muscle: No Joint: No Bone: No None] [3:Fat Layer (Subcutaneous Tissue): No Tendon: No Muscle: No Joint: No Bone: No None] Epithelialization: [1:Compression Therapy] [2:Compression Therapy] [3:Compression Therapy] Wound Number: 4 5 6  Photos: No Photos No Photos No Photos Left, Distal, Anterior Lower Leg Left, Distal, Lateral Lower Leg Left, Distal, Medial Lower Leg Wound Location: Trauma Trauma Trauma Wounding Event: Venous Leg Ulcer Venous Leg Ulcer Venous Leg Ulcer Primary Etiology: Asthma, Hypertension Asthma, Hypertension Asthma, Hypertension Comorbid History: 07/28/2018 07/28/2018 07/28/2018 Date Acquired: 1 1 1  Weeks of Treatment: Open Open Open Wound Status: 2.5x2.8x0.1 4x2.4x0.1 1.7x1.6x0.1 Measurements L x W x D (cm) 5.498 7.54 2.136 A (cm) : rea 0.55 0.754 0.214 Volume (cm) : 6.70% 4.00% 9.30% % Reduction in A rea: 6.60% 3.90% 9.30% % Reduction in Volume: Full Thickness Without Exposed Full Thickness Without Exposed Full Thickness Without Exposed Classification: Support Structures Support  Structures Support Structures Medium Medium Medium Exudate Amount: Serosanguineous Serosanguineous Serosanguineous Exudate Type: red, brown red, brown red, brown Exudate Color: N/A N/A N/A Wound Margin: Medium (34-66%) Medium (34-66%) Medium (34-66%) Granulation Amount: Red, Pink, Pale Red, Pink, Pale Red, Pink Granulation Quality: Medium (34-66%) Medium (34-66%) Medium (34-66%) Necrotic Amount: Fat Layer (Subcutaneous Tissue): Yes Fat Layer (Subcutaneous Tissue): Yes Fat Layer (Subcutaneous Tissue): Yes Exposed Structures: Fascia: No Fascia: No Fascia: No Tendon: No Tendon: No Tendon: No Muscle: No Muscle: No Muscle: No Joint: No Joint: No Joint: No Bone: No Bone: No Bone: No None None None Epithelialization: Compression Therapy Compression Therapy Compression Therapy Procedures Performed: Wound Number: 7 N/A N/A Photos: No Photos N/A N/A Right Foot N/A N/A Wound Location: Trauma N/A N/A Wounding Event: Trauma, Other N/A N/A Primary Etiology: Asthma, Hypertension N/A N/A Comorbid History: 03/12/2020 N/A N/A Date Acquired: 0 N/A N/A Weeks of Treatment: Open N/A N/A Wound Status: 0.2x0.2x0.2 N/A N/A Measurements L x W x D (cm) 0.031 N/A N/A A (cm) : rea 0.006 N/A N/A Volume (cm) : N/A N/A N/A % Reduction in Area: N/A N/A N/A % Reduction in Volume: Full Thickness Without Exposed N/A N/A Classification: Support Structures Medium N/A N/A Exudate Amount: Serosanguineous N/A N/A Exudate Type: red, brown N/A N/A Exudate Color: N/A N/A N/A Wound Margin: None Present (0%) N/A N/A Granulation Amount: N/A N/A N/A Granulation Quality: None Present (0%) N/A N/A Necrotic Amount: Fascia: No N/A N/A Exposed Structures: Fat Layer (Subcutaneous Tissue): No Tendon: No Muscle: No Joint: No Bone: No None N/A N/A Epithelialization: N/A N/A N/A Procedures Performed: Treatment Notes Electronic Signature(s) Signed: 03/12/2020 5:38:15 PM By: Zandra AbtsLynch,  Shatara RN, BSN Signed: 03/12/2020 7:23:52 PM By: Baltazar Najjarobson, Michael MD Entered By: Baltazar Najjarobson, Michael on 03/12/2020 16:21:38 -------------------------------------------------------------------------------- Multi-Disciplinary Care Plan Details Patient Name: Date of Service: Raymond ClaraBO O [place], [name] 03/12/2020 3:00 PM Medical Record Number: [id number] Patient Account Number: [id number] Date of Birth/Sex: Treating RN: Nov 27, 1970 (49 y.o.  Raymond Gonzalez Primary Care Yolanda Huffstetler: Bertram Denver Other Clinician: Referring Hashim Eichhorst: Treating Josha Weekley/Extender: Neale Burly in Treatment: 1 Active Inactive Venous Leg Ulcer Nursing Diagnoses: Knowledge deficit related to disease process and management Potential for venous Insuffiency (use before diagnosis confirmed) Goals: Patient will maintain optimal edema control Date Initiated: 03/06/2020 Target Resolution Date: 04/06/2020 Goal Status: Active Patient/caregiver will verbalize understanding of disease process and disease management Date Initiated: 03/06/2020 Target Resolution Date: 04/06/2020 Goal Status: Active Interventions: Assess peripheral edema status every visit. Compression as ordered Provide education on venous insufficiency Notes: Wound/Skin Impairment Nursing Diagnoses: Impaired tissue integrity Knowledge deficit related to smoking impact on wound healing Knowledge deficit related to ulceration/compromised skin integrity Goals: Patient will demonstrate a reduced rate of smoking or cessation of smoking Date Initiated: 03/06/2020 Target Resolution Date: 04/06/2020 Goal Status: Active Patient/caregiver will verbalize understanding of skin care regimen Date Initiated: 03/06/2020 Target Resolution Date: 04/06/2020 Goal Status: Active Interventions: Assess patient/caregiver ability to obtain necessary supplies Assess patient/caregiver ability to perform ulcer/skin care regimen upon admission and as  needed Assess ulceration(s) every visit Provide education on smoking Provide education on ulcer and skin care Notes: Electronic Signature(s) Signed: 03/12/2020 5:38:15 PM By: Zandra Abts RN, BSN Entered By: Zandra Abts on 03/12/2020 17:34:58 -------------------------------------------------------------------------------- Pain Assessment Details Patient Name: Date of Service: Raymond Gonzalez [place], [name] 03/12/2020 3:00 PM Medical Record Number: [id number] Patient Account Number: [id number] Date of Birth/Sex: Treating RN: Sep 27, 1970 (49 y.o. Melonie [place] Primary Care Daksha Koone: Bertram Denver Other Clinician: Referring Gisselle Galvis: Treating Bronte Kropf/Extender: Neale Burly in Treatment: 1 Active Problems Location of Pain Severity and Description of Pain Patient Has Paino Yes Site Locations With Dressing Change: Yes Duration of the Pain. Constant / Intermittento Intermittent How Long Does it Lasto Hours: Minutes: 15 Rate the pain. Current Pain Level: 2 Worst Pain Level: 6 Least Pain Level: 0 Tolerable Pain Level: 5 Character of Pain Describe the Pain: Burning Pain Management and Medication Current Pain Management: Medication: Yes Cold Application: No Rest: Yes Massage: No Activity: No T.E.N.S.: No Heat Application: No Leg drop or elevation: No Is the Current Pain Management Adequate: Inadequate How does your wound impact your activities of daily livingo Sleep: No Bathing: No Appetite: No Relationship With Others: No Bladder Continence: No Emotions: No Bowel Continence: No Work: No Toileting: No Drive: No Dressing: No Hobbies: No Electronic Signature(s) Signed: 03/12/2020 5:24:04 PM By: Yevonne Pax RN Entered By: Yevonne Pax on 03/12/2020 15:53:09 -------------------------------------------------------------------------------- Patient/Caregiver Education Details Patient Name: Date of Service: Raymond Craven, [name] 12/20/2021andnbsp3:00  PM Medical Record Number: [id number] Patient Account Number: [id number] Date of Birth/Gender: Treating RN: 10/11/1970 (49 y.o. Raymond Gonzalez Primary Care Physician: Bertram Denver Other Clinician: Referring Physician: Treating Physician/Extender: Neale Burly in Treatment: 1 Education Assessment Education Provided To: Patient Education Topics Provided Venous: Methods: Explain/Verbal Responses: State content correctly Wound/Skin Impairment: Methods: Explain/Verbal Responses: State content correctly Electronic Signature(s) Signed: 03/12/2020 5:38:15 PM By: Zandra Abts RN, BSN Entered By: Zandra Abts on 03/12/2020 17:35:10 -------------------------------------------------------------------------------- Wound Assessment Details Patient Name: Date of Service: Raymond Gonzalez [place], [name] 03/12/2020 3:00 PM Medical Record Number: [id number] Patient Account Number: [id number] Date of Birth/Sex: Treating RN: [birth year] (49 y.o. Melonie [place] Primary Care Lashone Stauber: Bertram Denver Other Clinician: Referring Zully Frane: Treating Shelbe Haglund/Extender: Davene Costain, Fredia Beets in Treatment: 1 Wound Status Wound Number: 1 Primary Etiology: Venous Leg Ulcer Wound Location: Left, Proximal, Anterior Lower Leg Wound Status: Open Wounding Event: Trauma  Comorbid History: Asthma, Hypertension Date Acquired: 07/28/2018 Weeks Of Treatment: 1 Clustered Wound: No Wound Measurements Length: (cm) 4 Width: (cm) 5 Depth: (cm) 0.1 Area: (cm) 15.708 Volume: (cm) 1.571 % Reduction in Area: -11.1% % Reduction in Volume: 44.4% Epithelialization: None Tunneling: No Undermining: No Wound Description Classification: Full Thickness Without Exposed Support Structures Wound Margin: Flat and Intact Exudate Amount: Medium Exudate Type: Serosanguineous Exudate Color: red, brown Foul Odor After Cleansing: No Slough/Fibrino Yes Wound Bed Granulation Amount:  Medium (34-66%) Exposed Structure Granulation Quality: Red Fascia Exposed: No Necrotic Amount: Medium (34-66%) Fat Layer (Subcutaneous Tissue) Exposed: Yes Necrotic Quality: Adherent Slough Tendon Exposed: No Muscle Exposed: No Joint Exposed: No Bone Exposed: No Treatment Notes Wound #1 (Lower Leg) Wound Laterality: Left, Anterior, Proximal Cleanser Soap and Water Discharge Instruction: May shower and wash wound with dial antibacterial soap and water prior to dressing change. Wound Cleanser Discharge Instruction: Cleanse the wound with wound cleanser prior to applying a clean dressing using gauze sponges, not tissue or cotton balls. Peri-Wound Care Triamcinolone 15 (g) Discharge Instruction: Use triamcinolone 15 (g) as directed Sween Lotion (Moisturizing lotion) Discharge Instruction: Apply moisturizing lotion as directed Topical Primary Dressing IODOFLEX 0.9% Cadexomer Iodine Pad 4x6 cm Discharge Instruction: Apply Iodoflex or Iodosorb to wound bed as instructed Secondary Dressing ABD Pad, 8x10 Discharge Instruction: Apply over primary dressing as directed. Zetuvit Plus 4x8 in Discharge Instruction: Apply over primary dressing as directed. Secured With Compression Wrap ThreePress (3 layer compression wrap) Discharge Instruction: Apply three layer compression as directed. Compression Stockings Add-Ons Electronic Signature(s) Signed: 03/12/2020 5:24:04 PM By: Yevonne Pax RN Entered By: Yevonne Pax on 03/12/2020 15:56:40 -------------------------------------------------------------------------------- Wound Assessment Details Patient Name: Date of Service: Raymond Gonzalez NE, [name] 03/12/2020 3:00 PM Medical Record Number: [id number] Patient Account Number: [id number] Date of Birth/Sex: Treating RN: [birth year] (49 y.o. Raymond Gonzalez) Yevonne Pax Primary Care Jabril Pursell: Bertram Denver Other Clinician: Referring Akoni Parton: Treating Owen Pagnotta/Extender: Neale Burly  in Treatment: 1 Wound Status Wound Number: 2 Primary Etiology: Venous Leg Ulcer Wound Location: Left, Proximal, Lateral Lower Leg Wound Status: Open Wounding Event: Trauma Comorbid History: Asthma, Hypertension Date Acquired: 07/28/2018 Weeks Of Treatment: 1 Clustered Wound: No Wound Measurements Length: (cm) 3 Width: (cm) 2.5 Depth: (cm) 0.2 Area: (cm) 5.89 Volume: (cm) 1.178 % Reduction in Area: -8.7% % Reduction in Volume: -8.7% Epithelialization: None Tunneling: No Undermining: No Wound Description Classification: Full Thickness Without Exposed Support Structures Exudate Amount: Medium Exudate Type: Serosanguineous Exudate Color: red, brown Foul Odor After Cleansing: No Slough/Fibrino Yes Wound Bed Granulation Amount: Medium (34-66%) Exposed Structure Granulation Quality: Red Fascia Exposed: No Necrotic Amount: Medium (34-66%) Fat Layer (Subcutaneous Tissue) Exposed: Yes Necrotic Quality: Adherent Slough Tendon Exposed: No Muscle Exposed: No Joint Exposed: No Bone Exposed: No Treatment Notes Wound #2 (Lower Leg) Wound Laterality: Left, Lateral, Proximal Cleanser Soap and Water Discharge Instruction: May shower and wash wound with dial antibacterial soap and water prior to dressing change. Wound Cleanser Discharge Instruction: Cleanse the wound with wound cleanser prior to applying a clean dressing using gauze sponges, not tissue or cotton balls. Peri-Wound Care Triamcinolone 15 (g) Discharge Instruction: Use triamcinolone 15 (g) as directed Sween Lotion (Moisturizing lotion) Discharge Instruction: Apply moisturizing lotion as directed Topical Primary Dressing IODOFLEX 0.9% Cadexomer Iodine Pad 4x6 cm Discharge Instruction: Apply Iodoflex or Iodosorb to wound bed as instructed Secondary Dressing ABD Pad, 8x10 Discharge Instruction: Apply over primary dressing as directed. Zetuvit Plus 4x8 in Discharge Instruction: Apply over primary dressing as  directed. Secured With Compression Wrap ThreePress (3 layer compression wrap) Discharge Instruction: Apply three layer compression as directed. Compression Stockings Add-Ons Electronic Signature(s) Signed: 03/12/2020 5:24:04 PM By: Yevonne Pax RN Entered By: Yevonne Pax on 03/12/2020 15:56:53 -------------------------------------------------------------------------------- Wound Assessment Details Patient Name: Date of Service: Raymond Gonzalez NE, [name] 03/12/2020 3:00 PM Medical Record Number: [id number] Patient Account Number: [id number] Date of Birth/Sex: Treating RN: 07/13/1970 (49 y.o. Raymond Gonzalez) Yevonne Pax Primary Care Rowe Warman: Bertram Denver Other Clinician: Referring Arm[place] Silveria: Treating Jemiah Ellenburg/Extender: Neale Burly in Treatment: 1 Wound Status Wound Number: 3 Primary Etiology: Venous Leg Ulcer Wound Location: Left, Anterior Lower Leg Wound Status: Open Wounding Event: Trauma Comorbid History: Asthma, Hypertension Date Acquired: 07/28/2018 Weeks Of Treatment: 1 Clustered Wound: No Wound Measurements Length: (cm) 3 Width: (cm) 2.5 Depth: (cm) 0.1 Area: (cm) 5.89 Volume: (cm) 0.589 % Reduction in Area: -4.2% % Reduction in Volume: 47.9% Epithelialization: None Tunneling: No Undermining: No Wound Description Classification: Full Thickness Without Exposed Support Structures Exudate Amount: Medium Exudate Type: Serosanguineous Exudate Color: red, brown Foul Odor After Cleansing: No Slough/Fibrino Yes Wound Bed Granulation Amount: Medium (34-66%) Exposed Structure Granulation Quality: Red, Pink, Pale Fascia Exposed: No Necrotic Amount: Medium (34-66%) Fat Layer (Subcutaneous Tissue) Exposed: No Necrotic Quality: Adherent Slough Tendon Exposed: No Muscle Exposed: No Joint Exposed: No Bone Exposed: No Treatment Notes Wound #3 (Lower Leg) Wound Laterality: Left, Anterior Cleanser Soap and Water Discharge Instruction: May shower and wash  wound with dial antibacterial soap and water prior to dressing change. Wound Cleanser Discharge Instruction: Cleanse the wound with wound cleanser prior to applying a clean dressing using gauze sponges, not tissue or cotton balls. Peri-Wound Care Triamcinolone 15 (g) Discharge Instruction: Use triamcinolone 15 (g) as directed Sween Lotion (Moisturizing lotion) Discharge Instruction: Apply moisturizing lotion as directed Topical Primary Dressing IODOFLEX 0.9% Cadexomer Iodine Pad 4x6 cm Discharge Instruction: Apply Iodoflex or Iodosorb to wound bed as instructed Secondary Dressing ABD Pad, 8x10 Discharge Instruction: Apply over primary dressing as directed. Zetuvit Plus 4x8 in Discharge Instruction: Apply over primary dressing as directed. Secured With Compression Wrap ThreePress (3 layer compression wrap) Discharge Instruction: Apply three layer compression as directed. Compression Stockings Add-Ons Electronic Signature(s) Signed: 03/12/2020 5:24:04 PM By: Yevonne Pax RN Entered By: Yevonne Pax on 03/12/2020 15:57:08 -------------------------------------------------------------------------------- Wound Assessment Details Patient Name: Date of Service: Raymond Gonzalez NE, [name] 03/12/2020 3:00 PM Medical Record Number: [id number] Patient Account Number: [id number] Date of Birth/Sex: Treating RN: January 02, 1971 (49 y.o. Raymond Gonzalez) Yevonne Pax Primary Care Mekaila Tarnow: Bertram [place] Other Clinician: Referring Kendon Sedeno: Treating Wanna Gully/Extender: Davene Costain, Fredia Beets in Treatment: 1 Wound Status Wound Number: 4 Primary Etiology: Venous Leg Ulcer Wound Location: Left, Distal, Anterior Lower Leg Wound Status: Open Wounding Event: Trauma Comorbid History: Asthma, Hypertension Date Acquired: 07/28/2018 Weeks Of Treatment: 1 Clustered Wound: No Wound Measurements Length: (cm) 2.5 Width: (cm) 2.8 Depth: (cm) 0.1 Area: (cm) 5.498 Volume: (cm) 0.55 % Reduction in Area:  6.7% % Reduction in Volume: 6.6% Epithelialization: None Tunneling: No Undermining: No Wound Description Classification: Full Thickness Without Exposed Support Structures Exudate Amount: Medium Exudate Type: Serosanguineous Exudate Color: red, brown Foul Odor After Cleansing: No Slough/Fibrino Yes Wound Bed Granulation Amount: Medium (34-66%) Exposed Structure Granulation Quality: Red, Pink, Pale Fascia Exposed: No Necrotic Amount: Medium (34-66%) Fat Layer (Subcutaneous Tissue) Exposed: Yes Necrotic Quality: Adherent Slough Tendon Exposed: No Muscle Exposed: No Joint Exposed: No Bone Exposed: No Treatment Notes Wound #4 (Lower Leg) Wound Laterality: Left, Anterior, Distal Cleanser Soap  and Water Discharge Instruction: May shower and wash wound with dial antibacterial soap and water prior to dressing change. Wound Cleanser Discharge Instruction: Cleanse the wound with wound cleanser prior to applying a clean dressing using gauze sponges, not tissue or cotton balls. Peri-Wound Care Triamcinolone 15 (g) Discharge Instruction: Use triamcinolone 15 (g) as directed Sween Lotion (Moisturizing lotion) Discharge Instruction: Apply moisturizing lotion as directed Topical Primary Dressing IODOFLEX 0.9% Cadexomer Iodine Pad 4x6 cm Discharge Instruction: Apply Iodoflex or Iodosorb to wound bed as instructed Secondary Dressing ABD Pad, 8x10 Discharge Instruction: Apply over primary dressing as directed. Zetuvit Plus 4x8 in Discharge Instruction: Apply over primary dressing as directed. Secured With Compression Wrap ThreePress (3 layer compression wrap) Discharge Instruction: Apply three layer compression as directed. Compression Stockings Add-Ons Electronic Signature(s) Signed: 03/12/2020 5:24:04 PM By: Yevonne Pax RN Entered By: Yevonne Pax on 03/12/2020 15:57:18 -------------------------------------------------------------------------------- Wound Assessment  Details Patient Name: Date of Service: Raymond Gonzalez NE, [name] 03/12/2020 3:00 PM Medical Record Number: [id number] Patient Account Number: [id number] Date of Birth/Sex: Treating RN: April 03, 1970 (49 y.o. Raymond Gonzalez) Yevonne Pax Primary Care Arfa Lamarca: Bertram [place] Other Clinician: Referring Teagan Ozawa: Treating Renae Mottley/Extender: Davene Costain, Fredia Beets in Treatment: 1 Wound Status Wound Number: 5 Primary Etiology: Venous Leg Ulcer Wound Location: Left, Distal, Lateral Lower Leg Wound Status: Open Wounding Event: Trauma Comorbid History: Asthma, Hypertension Date Acquired: 07/28/2018 Weeks Of Treatment: 1 Clustered Wound: No Wound Measurements Length: (cm) 4 Width: (cm) 2.4 Depth: (cm) 0.1 Area: (cm) 7.54 Volume: (cm) 0.754 % Reduction in Area: 4% % Reduction in Volume: 3.9% Epithelialization: None Tunneling: No Undermining: No Wound Description Classification: Full Thickness Without Exposed Support Structures Exudate Amount: Medium Exudate Type: Serosanguineous Exudate Color: red, brown Foul Odor After Cleansing: No Slough/Fibrino Yes Wound Bed Granulation Amount: Medium (34-66%) Exposed Structure Granulation Quality: Red, Pink, Pale Fascia Exposed: No Necrotic Amount: Medium (34-66%) Fat Layer (Subcutaneous Tissue) Exposed: Yes Necrotic Quality: Adherent Slough Tendon Exposed: No Muscle Exposed: No Joint Exposed: No Bone Exposed: No Treatment Notes Wound #5 (Lower Leg) Wound Laterality: Left, Lateral, Distal Cleanser Soap and Water Discharge Instruction: May shower and wash wound with dial antibacterial soap and water prior to dressing change. Wound Cleanser Discharge Instruction: Cleanse the wound with wound cleanser prior to applying a clean dressing using gauze sponges, not tissue or cotton balls. Peri-Wound Care Triamcinolone 15 (g) Discharge Instruction: Use triamcinolone 15 (g) as directed Sween Lotion (Moisturizing lotion) Discharge Instruction:  Apply moisturizing lotion as directed Topical Primary Dressing IODOFLEX 0.9% Cadexomer Iodine Pad 4x6 cm Discharge Instruction: Apply Iodoflex or Iodosorb to wound bed as instructed Secondary Dressing ABD Pad, 8x10 Discharge Instruction: Apply over primary dressing as directed. Zetuvit Plus 4x8 in Discharge Instruction: Apply over primary dressing as directed. Secured With Compression Wrap ThreePress (3 layer compression wrap) Discharge Instruction: Apply three layer compression as directed. Compression Stockings Add-Ons Electronic Signature(s) Signed: 03/12/2020 5:24:04 PM By: Yevonne Pax RN Entered By: Yevonne Pax on 03/12/2020 15:57:27 -------------------------------------------------------------------------------- Wound Assessment Details Patient Name: Date of Service: Raymond Gonzalez NE, [name] 03/12/2020 3:00 PM Medical Record Number: [id number] Patient Account Number: [id number] Date of Birth/Sex: Treating RN: 1970/05/22 (49 y.o. Raymond Gonzalez) Yevonne Pax Primary Care Shawndrea Rutkowski: Bertram [place] Other Clinician: Referring Deondrea Aguado: Treating Leeyah Heather/Extender: Neale Burly in Treatment: 1 Wound Status Wound Number: 6 Primary Etiology: Venous Leg Ulcer Wound Location: Left, Distal, Medial Lower Leg Wound Status: Open Wounding Event: Trauma Comorbid History: Asthma, Hypertension Date Acquired: 07/28/2018 Weeks Of Treatment: 1 Clustered Wound: No  Wound Measurements Length: (cm) 1.7 Width: (cm) 1.6 Depth: (cm) 0.1 Area: (cm) 2.136 Volume: (cm) 0.214 % Reduction in Area: 9.3% % Reduction in Volume: 9.3% Epithelialization: None Tunneling: No Undermining: No Wound Description Classification: Full Thickness Without Exposed Support Structures Exudate Amount: Medium Exudate Type: Serosanguineous Exudate Color: red, brown Foul Odor After Cleansing: No Slough/Fibrino Yes Wound Bed Granulation Amount: Medium (34-66%) Exposed Structure Granulation Quality:  Red, Pink Fascia Exposed: No Necrotic Amount: Medium (34-66%) Fat Layer (Subcutaneous Tissue) Exposed: Yes Necrotic Quality: Adherent Slough Tendon Exposed: No Muscle Exposed: No Joint Exposed: No Bone Exposed: No Treatment Notes Wound #6 (Lower Leg) Wound Laterality: Left, Medial, Distal Cleanser Soap and Water Discharge Instruction: May shower and wash wound with dial antibacterial soap and water prior to dressing change. Wound Cleanser Discharge Instruction: Cleanse the wound with wound cleanser prior to applying a clean dressing using gauze sponges, not tissue or cotton balls. Peri-Wound Care Triamcinolone 15 (g) Discharge Instruction: Use triamcinolone 15 (g) as directed Sween Lotion (Moisturizing lotion) Discharge Instruction: Apply moisturizing lotion as directed Topical Primary Dressing IODOFLEX 0.9% Cadexomer Iodine Pad 4x6 cm Discharge Instruction: Apply Iodoflex or Iodosorb to wound bed as instructed Secondary Dressing ABD Pad, 8x10 Discharge Instruction: Apply over primary dressing as directed. Zetuvit Plus 4x8 in Discharge Instruction: Apply over primary dressing as directed. Secured With Compression Wrap ThreePress (3 layer compression wrap) Discharge Instruction: Apply three layer compression as directed. Compression Stockings Add-Ons Electronic Signature(s) Signed: 03/12/2020 5:24:04 PM By: Yevonne Pax RN Entered By: Yevonne Pax on 03/12/2020 15:57:37 -------------------------------------------------------------------------------- Wound Assessment Details Patient Name: Date of Service: Raymond Gonzalez NE, [name] 03/12/2020 3:00 PM Medical Record Number: [id number] Patient Account Number: [id number] Date of Birth/Sex: Treating RN: [birth year] (49 y.o. Raymond Gonzalez) Yevonne Pax Primary Care Halea Lieb: Bertram [place] Other Clinician: Referring Peggy Loge: Treating Tirsa Gail/Extender: Davene Costain, Fredia Beets in Treatment: 1 Wound Status Wound Number:  7 Primary Etiology: Trauma, Other Wound Location: Right Foot Wound Status: Open Wounding Event: Trauma Comorbid History: Asthma, Hypertension Date Acquired: 03/12/2020 Weeks Of Treatment: 0 Clustered Wound: No Wound Measurements Length: (cm) 0.2 Width: (cm) 0.2 Depth: (cm) 0.2 Area: (cm) 0.031 Volume: (cm) 0.006 % Reduction in Area: % Reduction in Volume: Epithelialization: None Tunneling: No Undermining: No Wound Description Classification: Full Thickness Without Exposed Support Structures Exudate Amount: Medium Exudate Type: Serosanguineous Exudate Color: red, brown Foul Odor After Cleansing: No Slough/Fibrino No Wound Bed Granulation Amount: None Present (0%) Exposed Structure Necrotic Amount: None Present (0%) Fascia Exposed: No Fat Layer (Subcutaneous Tissue) Exposed: No Tendon Exposed: No Muscle Exposed: No Joint Exposed: No Bone Exposed: No Treatment Notes Wound #7 (Foot) Wound Laterality: Right Cleanser Peri-Wound Care Topical Primary Dressing Secondary Dressing Secured With Compression Wrap Compression Stockings Add-Ons Electronic Signature(s) Signed: 03/12/2020 5:24:04 PM By: Yevonne Pax RN Entered By: Yevonne Pax on 03/12/2020 15:59:01 -------------------------------------------------------------------------------- Vitals Details Patient Name: Date of Service: Raymond Gonzalez NE, [name] 03/12/2020 3:00 PM Medical Record Number: [id number] Patient Account Number: [id number] Date of Birth/Sex: Treating RN: 01/20/1971 (49 y.o. Raymond Gonzalez) Yevonne Pax Primary Care Artem Bunte: Bertram [place] Other Clinician: Referring Demetrie Borge: Treating Kearstin Learn/Extender: Davene Costain, Fredia Beets in Treatment: 1 Vital Signs Time Taken: 15:51 Temperature (F): 98.4 Height (in): 73 Pulse (bpm): 91 Weight (lbs): 280 Respiratory Rate (breaths/min): 18 Body Mass Index (BMI): 36.9 Blood Pressure (mmHg): 171/104 Reference Range: 80 - 120 mg / dl Electronic  Signature(s) Signed: 03/12/2020 5:24:04 PM By: Yevonne Pax RN Entered By: Yevonne Pax on 03/12/2020 15:52:29

## 2020-03-12 NOTE — Progress Notes (Signed)
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 dressing using gauze sponges, not tissue or cotton balls. Peri-Wound Care: Triamcinolone 15 (g) 1 x Per Week/7 Days Discharge Instructions: Use triamcinolone 15 (g) as directed Peri-Wound Care: Sween Lotion (Moisturizing lotion) 1 x Per Week/7 Days Discharge Instructions: Apply moisturizing lotion as directed Prim Dressing: IODOFLEX 0.9% Cadexomer Iodine Pad 4x6 cm 1 x Per Week/7 Days ary Discharge Instructions: Apply Iodoflex or Iodosorb to wound bed as instructed Secondary Dressing: ABD Pad, 8x10 1 x Per Week/7 Days Discharge Instructions: Apply over primary dressing as directed. Secondary Dressing: Zetuvit Plus 4x8 in 1 x Per Week/7 Days Discharge Instructions: Apply over primary dressing as directed. Com pression Wrap: ThreePress (3 layer compression wrap) 1 x Per Week/7 Days Discharge Instructions: Apply three layer compression as directed. WOUND #4: - Lower Leg Wound Laterality: Left, Anterior, Distal Cleanser: Soap and Water 1 x Per Week/7 Days Discharge Instructions: May shower and wash wound with dial antibacterial soap and water prior to dressing change. Cleanser: Wound Cleanser 1 x Per  Week/7 Days Discharge Instructions: Cleanse the wound with wound cleanser prior to applying a clean dressing using gauze sponges, not tissue or cotton balls. Peri-Wound Care: Triamcinolone 15 (g) 1 x Per Week/7 Days Discharge Instructions: Use triamcinolone 15 (g) as directed Peri-Wound Care: Sween Lotion (Moisturizing lotion) 1 x Per Week/7 Days Discharge Instructions: Apply moisturizing lotion as directed Prim Dressing: IODOFLEX 0.9% Cadexomer Iodine Pad 4x6 cm 1 x Per Week/7 Days ary Discharge Instructions: Apply Iodoflex or Iodosorb to wound bed as instructed Secondary Dressing: ABD Pad, 8x10 1 x Per Week/7 Days Discharge Instructions: Apply over primary dressing as directed. Secondary Dressing: Zetuvit Plus 4x8 in 1 x Per Week/7 Days Discharge Instructions: Apply over primary dressing as directed. Com pression Wrap: ThreePress (3 layer compression wrap) 1 x Per Week/7 Days Discharge Instructions: Apply three layer compression as directed. WOUND #5: - Lower Leg Wound Laterality: Left, Lateral, Distal Cleanser: Soap and Water 1 x Per Week/7 Days Discharge Instructions: May shower and wash wound with dial antibacterial soap and water prior to dressing change. Cleanser: Wound Cleanser 1 x Per Week/7 Days Discharge Instructions: Cleanse the wound with wound cleanser prior to applying a clean dressing using gauze sponges, not tissue or cotton balls. Peri-Wound Care: Triamcinolone 15 (g) 1 x Per Week/7 Days Discharge Instructions: Use triamcinolone 15 (g) as directed Peri-Wound Care: Sween Lotion (Moisturizing lotion) 1 x Per Week/7 Days Discharge Instructions: Apply moisturizing lotion as directed Prim Dressing: IODOFLEX 0.9% Cadexomer Iodine Pad 4x6 cm 1 x Per Week/7 Days ary Discharge Instructions: Apply Iodoflex or Iodosorb to wound bed as instructed Secondary Dressing: ABD Pad, 8x10 1 x Per Week/7 Days Discharge Instructions: Apply over primary dressing as directed. Secondary  Dressing: Zetuvit Plus 4x8 in 1 x Per Week/7 Days Discharge Instructions: Apply over primary dressing as directed. Com pression Wrap: ThreePress (3 layer compression wrap) 1 x Per Week/7 Days Discharge Instructions: Apply three layer compression as directed. WOUND #6: - Lower Leg Wound Laterality: Left, Medial, Distal Cleanser: Soap and Water 1 x Per Week/7 Days  Discharge Instructions: May shower and wash wound with dial antibacterial soap and water prior to dressing change. Cleanser: Wound Cleanser 1 x Per Week/7 Days Discharge Instructions: Cleanse the wound with wound cleanser prior to applying a clean dressing using gauze sponges, not tissue or cotton balls. Peri-Wound Care: Triamcinolone 15 (g) 1 x Per Week/7 Days Discharge Instructions: Use triamcinolone 15 (g) as directed Peri-Wound Care: Sween Lotion (Moisturizing lotion) 1 x Per Week/7 Days Discharge Instructions: Apply moisturizing lotion as directed Prim Dressing: IODOFLEX 0.9% Cadexomer Iodine Pad 4x6 cm 1 x Per Week/7 Days ary Discharge Instructions: Apply Iodoflex or Iodosorb to wound bed as instructed Secondary Dressing: ABD Pad, 8x10 1 x Per Week/7 Days Discharge Instructions: Apply over primary dressing as directed. Secondary Dressing: Zetuvit Plus 4x8 in 1 x Per Week/7 Days Discharge Instructions: Apply over primary dressing as directed. Com pression Wrap: ThreePress (3 layer compression wrap) 1 x Per Week/7 Days Discharge Instructions: Apply three layer compression as directed. 1. I still want to use Iodoflex here. He is going to need compression and I talked to him about this if we do not control the edema not only will we not heal these wounds but is likely they are going to get worse/at risk for circumferential skin breakdown here. 2. Liberal TCA to control the stasis dermatitis 3. T him to keep his leg elevated old 4. No debridement at this point until we control the swelling. I see nothing that would dictate the  need for antibiotics 5. I did not see anything on the right plantar foot to be concerned about. Electronic Signature(s) Signed: 03/12/2020 7:23:52 PM By: Baltazar Najjar MD Entered By: Baltazar Najjar on 03/12/2020 16:25:11 -------------------------------------------------------------------------------- SuperBill Details Patient Name: Date of Service: Raymond Gonzalez NE, [name] 03/12/2020 Medical Record Number: [id number] Patient Account Number: [id number] Date of Birth/Sex: Treating RN: [birth year] (49 y.o. 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 (idiopathic) with ulcer and inflammation of left lower extremity Facility Procedures CPT4 Code: [id number] Description: (Facility Use Only) [phone] - APPLY MULTLAY COMPRS LWR LT LEG Modifier: Quantity: 1 Physician Procedures : CPT4 Code Description Modifier [id number] - WC PHYS LEVEL 4 - EST PT ICD-10 Diagnosis Description L97.821 Non-pressure chronic ulcer of other part of left lower leg limited to breakdown of skin I87.332 Chronic venous hypertension (idiopathic) with  ulcer and inflammation of left lower extremity Quantity: 1 Electronic Signature(s) Signed: 03/12/2020 5:38:15 PM By: Zandra Abts RN, BSN Signed: 03/12/2020 7:23:52 PM By: Baltazar Najjar MD Entered By: Zandra Abts on 03/12/2020 17:35:19

## 2020-03-19 ENCOUNTER — Other Ambulatory Visit: Payer: Self-pay

## 2020-03-19 ENCOUNTER — Encounter (HOSPITAL_BASED_OUTPATIENT_CLINIC_OR_DEPARTMENT_OTHER): Payer: Medicaid Other | Admitting: Internal Medicine

## 2020-03-20 NOTE — Progress Notes (Signed)
Raymond, Gonzalez ([id number]) Visit Report for 03/19/2020 Arrival Information Details Patient Name: Date of Service: Raymond Gonzalez, [name] TTHEW 03/19/2020 2:45 PM Medical Record Number: [id number] Patient Account Number: [id number] Date of Birth/Sex: Treating RN: 06/29/1970 (49 y.o. Harlon Flor, Millard.Loa Primary Care Kindred Reidinger: Bertram Denver Other Clinician: Referring Julian Medina: Treating Tasmia Blumer/Extender: Neale Burly in Treatment: 2 Visit Information History Since Last Visit Added or deleted any medications: No Patient Arrived: Ambulatory Any new allergies or adverse reactions: No Arrival Time: 15:17 Had a fall or experienced change in No Accompanied By: self activities of daily living that may affect Transfer Assistance: None risk of falls: Patient Identification Verified: Yes Signs or symptoms of abuse/neglect since last visito No Secondary Verification Process Completed: Yes Hospitalized since last visit: No Patient Requires Transmission-Based Precautions: No Implantable device outside of the clinic excluding No Patient Has Alerts: No cellular tissue based products placed in the center since last visit: Has Dressing in Place as Prescribed: Yes Pain Present Now: Yes Electronic Signature(s) Signed: 03/20/2020 12:40:32 PM By: Karl Ito Entered By: Karl Ito on 03/19/2020 15:19:11 -------------------------------------------------------------------------------- Compression Therapy Details Patient Name: Date of Service: Raymond [place], [name] 03/19/2020 2:45 PM Medical Record Number: [id number] Patient Account Number: [id number] Date of Birth/Sex: Treating RN: 12-03-1970 (49 y.o. Raymond Gonzalez Primary Care Jamyra Zweig: Bertram Denver Other Clinician: Referring Toula Miyasaki: Treating Pallavi Clifton/Extender: Neale Burly in Treatment: 2 Compression Therapy Performed for Wound Assessment: Wound #3 Left,Anterior Lower Leg Performed By:  Clinician Zenaida Deed, RN Compression Type: Three Layer Post Procedure Diagnosis Same as Pre-procedure Electronic Signature(s) Signed: 03/19/2020 5:55:15 PM By: Zenaida Deed RN, BSN Entered By: Zenaida Deed on 03/19/2020 16:14:19 -------------------------------------------------------------------------------- Encounter Discharge Information Details Patient Name: Date of Service: Raymond [place], [name] 03/19/2020 2:45 PM Medical Record Number: [id number] Patient Account Number: [id number] Date of Birth/Sex: Treating RN: 1970/06/09 (48 y.o. Raymond Gonzalez Primary Care Juanelle Trueheart: Bertram Denver Other Clinician: Referring Almendra Loria: Treating Karyn Brull/Extender: Neale Burly in Treatment: 2 Encounter Discharge Information Items Discharge Condition: Stable Ambulatory Status: Ambulatory Discharge Destination: Home Transportation: Private Auto Accompanied By: self Schedule Follow-up Appointment: Yes Clinical Summary of Care: Patient Declined Electronic Signature(s) Signed: 03/19/2020 5:55:15 PM By: Zenaida Deed RN, BSN Entered By: Zenaida Deed on 03/19/2020 16:34:13 -------------------------------------------------------------------------------- Lower Extremity Assessment Details Patient Name: Date of Service: Raymond [place], [name] 03/19/2020 2:45 PM Medical Record Number: [id number] Patient Account Number: [id number] Date of Birth/Sex: Treating RN: Jul 30, 1970 (49 y.o. Raymond Gonzalez Primary Care Yusif Gnau: Bertram Denver Other Clinician: Referring Kishan Wachsmuth: Treating Derica Leiber/Extender: Davene Costain, Fredia Beets in Treatment: 2 Edema Assessment Assessed: [Left: No] [Right: No] Edema: [Left: Ye] [Right: s] Calf Left: Right: Point of Measurement: 46 cm From Medial Instep 42 cm Ankle Left: Right: Point of Measurement: 11 cm From Medial Instep 27.4 cm Vascular Assessment Pulses: Dorsalis Pedis Palpable: [Left:Yes] Electronic  Signature(s) Signed: 03/19/2020 5:55:15 PM By: Zenaida Deed RN, BSN Entered By: Zenaida Deed on 03/19/2020 15:58:40 -------------------------------------------------------------------------------- Multi Wound Chart Details Patient Name: Date of Service: Raymond [place], [name] 03/19/2020 2:45 PM Medical Record Number: [id number] Patient Account Number: [id number] Date of Birth/Sex: Treating RN: February 10, 1971 (49 y.o. Raymond Gonzalez Primary Care Fareeda Downard: Bertram Denver Other Clinician: Referring Tristine Langi: Treating Jamarco Zaldivar/Extender: Davene Costain, Fredia Beets in Treatment: 2 Vital Signs Height(in): 73 Pulse(bpm): 82 Weight(lbs): 280 Blood Pressure(mmHg): 152/72 Body Mass Index(BMI): 37 Temperature(F): 97.4 Respiratory Rate(breaths/min): 18 Photos: [1:No Photos Left, Proximal, Anterior Lower Leg] [2:No Photos Left, Proximal,  Lateral Lower Leg] [3:No Photos Left, Anterior Lower Leg] Wound Location: [1:Trauma] [2:Trauma] [3:Trauma] Wounding Event: [1:Venous Leg Ulcer] [2:Venous Leg Ulcer] [3:Venous Leg Ulcer] Primary Etiology: [1:Asthma, Hypertension] [2:Asthma, Hypertension] [3:Asthma, Hypertension] Comorbid History: [1:07/28/2018] [2:07/28/2018] [3:07/28/2018] Date Acquired: [1:2] [2:2] [3:2] Weeks of Treatment: [1:Open] [2:Open] [3:Open] Wound Status: [1:4x4.4x0.1] [2:2.5x2.5x0.1] [3:2.9x1.9x0.1] Measurements L x W x D (cm) [1:13.823] [2:4.909] [3:4.328] A (cm) : rea [1:1.382] [2:0.491] [3:0.433] Volume (cm) : [1:2.20%] [2:9.40%] [3:23.50%] % Reduction in A rea: [1:51.10%] [2:54.70%] [3:61.70%] % Reduction in Volume: [1:No] [2:No] [3:No] Tunneling: [1:Full Thickness Without Exposed] [2:Full Thickness Without Exposed] [3:Full Thickness Without Exposed] Classification: [1:Support Structures Medium] [2:Support Structures Medium] [3:Support Structures Medium] Exudate Amount: [1:Serosanguineous] [2:Serosanguineous] [3:Serosanguineous] Exudate Type: [1:red, brown] [2:red,  brown] [3:red, brown] Exudate Color: [1:Flat and Intact] [2:Flat and Intact] [3:Flat and Intact] Wound Margin: [1:Large (67-100%)] [2:Large (67-100%)] [3:Large (67-100%)] Granulation Amount: [1:Red] [2:Red] [3:Red] Granulation Quality: [1:Small (1-33%)] [2:Small (1-33%)] [3:Small (1-33%)] Necrotic Amount: [1:Fat Layer (Subcutaneous Tissue): Yes Fat Layer (Subcutaneous Tissue): Yes Fat Layer (Subcutaneous Tissue): Yes] Exposed Structures: [1:Fascia: No Tendon: No Muscle: No Joint: No Bone: No None] [2:Fascia: No Tendon: No Muscle: No Joint: No Bone: No None] [3:Fascia: No Tendon: No Muscle: No Joint: No Bone: No None] Epithelialization: [1:N/A] [2:N/A] [3:Compression Therapy] Wound Number: 4 5 6  Photos: No Photos No Photos No Photos Left, Distal, Anterior Lower Leg Left, Distal, Lateral Lower Leg Left, Distal, Medial Lower Leg Wound Location: Trauma Trauma Trauma Wounding Event: Venous Leg Ulcer Venous Leg Ulcer Venous Leg Ulcer Primary Etiology: Asthma, Hypertension Asthma, Hypertension Asthma, Hypertension Comorbid History: 07/28/2018 07/28/2018 07/28/2018 Date Acquired: 2 2 2  Weeks of Treatment: Open Open Open Wound Status: 2.5x1.5x0.1 3x1.5x0.1 1.8x1.7x0.1 Measurements L x W x D (cm) 2.945 3.534 2.403 A (cm) : rea 0.295 0.353 0.24 Volume (cm) : 50.00% 55.00% -2.00% % Reduction in A rea: 49.90% 55.00% -1.70% % Reduction in Volume: No No No Tunneling: Full Thickness Without Exposed Full Thickness Without Exposed Full Thickness Without Exposed Classification: Support Structures Support Structures Support Structures Medium Medium Medium Exudate Amount: Serosanguineous Serosanguineous Serosanguineous Exudate Type: red, brown red, brown red, brown Exudate Color: Flat and Intact Flat and Intact Flat and Intact Wound Margin: Large (67-100%) Medium (34-66%) Large (67-100%) Granulation Amount: Red Red, Pink, Pale Red Granulation Quality: Small (1-33%) Medium (34-66%) Small  (1-33%) Necrotic Amount: Fat Layer (Subcutaneous Tissue): Yes Fat Layer (Subcutaneous Tissue): Yes Fat Layer (Subcutaneous Tissue): Yes Exposed Structures: Fascia: No Fascia: No Fascia: No Tendon: No Tendon: No Tendon: No Muscle: No Muscle: No Muscle: No Joint: No Joint: No Joint: No Bone: No Bone: No Bone: No None None None Epithelialization: N/A N/A N/A Procedures Performed: Wound Number: 7 N/A N/A Photos: No Photos N/A N/A Right Foot N/A N/A Wound Location: Trauma N/A N/A Wounding Event: Trauma, Other N/A N/A Primary Etiology: Asthma, Hypertension N/A N/A Comorbid History: 03/12/2020 N/A N/A Date Acquired: 1 N/A N/A Weeks of Treatment: Open N/A N/A Wound Status: 0.2x0.2x0.3 N/A N/A Measurements L x W x D (cm) 0.031 N/A N/A A (cm) : rea 0.009 N/A N/A Volume (cm) : 0.00% N/A N/A % Reduction in A rea: -50.00% N/A N/A % Reduction in Volume: 12 Position 1 (o'clock): 1.2 Maximum Distance 1 (cm): Yes N/A N/A Tunneling: Full Thickness Without Exposed N/A N/A Classification: Support Structures Small N/A N/A Exudate Amount: Purulent N/A N/A Exudate Type: yellow, brown, green N/A N/A Exudate Color: Well defined, not attached N/A N/A Wound Margin: None Present (0%) N/A N/A Granulation Amount: N/A N/A  N/A Granulation Quality: None Present (0%) N/A N/A Necrotic Amount: Fascia: No N/A N/A Exposed Structures: Fat Layer (Subcutaneous Tissue): No Tendon: No Muscle: No Joint: No Bone: No None N/A N/A Epithelialization: N/A N/A N/A Procedures Performed: Treatment Notes Electronic Signature(s) Signed: 03/19/2020 5:55:20 PM By: Shawn Stall Signed: 03/20/2020 2:27:07 PM By: Baltazar Najjar MD Entered By: Baltazar Najjar on 03/19/2020 16:25:47 -------------------------------------------------------------------------------- Multi-Disciplinary Care Plan Details Patient Name: Date of Service: Raymond [place], [name] 03/19/2020 2:45 PM Medical Record  Number: [id number] Patient Account Number: [id number] Date of Birth/Sex: Treating RN: 24-Jan-1971 (49 y.o. Raymond Gonzalez Primary Care Kloe Oates: Bertram Denver Other Clinician: Referring Kiara Mcdowell: Treating Shannell Mikkelsen/Extender: Neale Burly in Treatment: 2 Active Inactive Venous Leg Ulcer Nursing Diagnoses: Knowledge deficit related to disease process and management Potential for venous Insuffiency (use before diagnosis confirmed) Goals: Patient will maintain optimal edema control Date Initiated: 03/06/2020 Target Resolution Date: 04/06/2020 Goal Status: Active Patient/caregiver will verbalize understanding of disease process and disease management Date Initiated: 03/06/2020 Target Resolution Date: 04/06/2020 Goal Status: Active Interventions: Assess peripheral edema status every visit. Compression as ordered Provide education on venous insufficiency Notes: Wound/Skin Impairment Nursing Diagnoses: Impaired tissue integrity Knowledge deficit related to smoking impact on wound healing Knowledge deficit related to ulceration/compromised skin integrity Goals: Patient will demonstrate a reduced rate of smoking or cessation of smoking Date Initiated: 03/06/2020 Target Resolution Date: 04/06/2020 Goal Status: Active Patient/caregiver will verbalize understanding of skin care regimen Date Initiated: 03/06/2020 Target Resolution Date: 04/06/2020 Goal Status: Active Interventions: Assess patient/caregiver ability to obtain necessary supplies Assess patient/caregiver ability to perform ulcer/skin care regimen upon admission and as needed Assess ulceration(s) every visit Provide education on smoking Provide education on ulcer and skin care Notes: Electronic Signature(s) Signed: 03/19/2020 5:55:15 PM By: Zenaida Deed RN, BSN Entered By: Zenaida Deed on 03/19/2020  16:10:28 -------------------------------------------------------------------------------- Pain Assessment Details Patient Name: Date of Service: Raymond Clara [place], [name] 03/19/2020 2:45 PM Medical Record Number: [id number] Patient Account Number: [id number] Date of Birth/Sex: Treating RN: [birth year] (49 y.o. Raymond Gonzalez Primary Care Merwin Breden: Bertram Denver Other Clinician: Referring Kierre Deines: Treating Parker Sawatzky/Extender: Neale Burly in Treatment: 2 Active Problems Location of Pain Severity and Description of Pain Patient Has Paino Yes Site Locations Pain Location: Pain in Ulcers With Dressing Change: Yes Duration of the Pain. Constant / Intermittento Constant Rate the pain. Current Pain Level: 4 Worst Pain Level: 5 Least Pain Level: 1 Character of Pain Describe the Pain: Aching Pain Management and Medication Current Pain Management: Rest: Yes Is the Current Pain Management Adequate: Adequate How does your wound impact your activities of daily livingo Sleep: Yes Bathing: No Appetite: No Relationship With Others: No Bladder Continence: No Emotions: No Bowel Continence: No Work: No Toileting: No Drive: No Dressing: No Hobbies: No Electronic Signature(s) Signed: 03/19/2020 5:55:15 PM By: Zenaida Deed RN, BSN Signed: 03/19/2020 5:55:20 PM By: Shawn Stall Entered By: Zenaida Deed on 03/19/2020 15:57:55 -------------------------------------------------------------------------------- Patient/Caregiver Education Details Patient Name: Date of Service: [name], [name] 12/27/2021andnbsp2:45 PM Medical Record Number: [id number] Patient Account Number: [id number] Date of Birth/Gender: Treating RN: January 25, 1971 (49 y.o. Raymond Gonzalez Primary Care Physician: Bertram Denver Other Clinician: Referring Physician: Treating Physician/Extender: Neale Burly in Treatment: 2 Education Assessment Education Provided  To: Patient Education Topics Provided Venous: Methods: Explain/Verbal Responses: Reinforcements needed, State content correctly Wound/Skin Impairment: Methods: Explain/Verbal Responses: Reinforcements needed, State content correctly Electronic Signature(s) Signed: 03/19/2020 5:55:15 PM By: Zenaida Deed RN, BSN  Entered By: Zenaida Deed on 03/19/2020 16:11:30 -------------------------------------------------------------------------------- Wound Assessment Details Patient Name: Date of Service: Raymond Gonzalez, [name] TTHEW 03/19/2020 2:45 PM Medical Record Number: [id number] Patient Account Number: [id number] Date of Birth/Sex: Treating RN: April 13, 1970 (49 y.o. Raymond Gonzalez Primary Care Jaelie Aguilera: Bertram Denver Other Clinician: Referring Arlie Riker: Treating Brittish Bolinger/Extender: Davene Costain, Fredia Beets in Treatment: 2 Wound Status Wound Number: 1 Primary Etiology: Venous Leg Ulcer Wound Location: Left, Proximal, Anterior Lower Leg Wound Status: Open Wounding Event: Trauma Comorbid History: Asthma, Hypertension Date Acquired: 07/28/2018 Weeks Of Treatment: 2 Clustered Wound: No Wound Measurements Length: (cm) 4 Width: (cm) 4.4 Depth: (cm) 0.1 Area: (cm) 13.823 Volume: (cm) 1.382 % Reduction in Area: 2.2% % Reduction in Volume: 51.1% Epithelialization: None Tunneling: No Undermining: No Wound Description Classification: Full Thickness Without Exposed Support Structures Wound Margin: Flat and Intact Exudate Amount: Medium Exudate Type: Serosanguineous Exudate Color: red, brown Foul Odor After Cleansing: No Slough/Fibrino Yes Wound Bed Granulation Amount: Large (67-100%) Exposed Structure Granulation Quality: Red Fascia Exposed: No Necrotic Amount: Small (1-33%) Fat Layer (Subcutaneous Tissue) Exposed: Yes Necrotic Quality: Adherent Slough Tendon Exposed: No Muscle Exposed: No Joint Exposed: No Bone Exposed: No Treatment Notes Wound #1 (Lower Leg)  Wound Laterality: Left, Anterior, Proximal Cleanser Soap and Water Discharge Instruction: May shower and wash wound with dial antibacterial soap and water prior to dressing change. Wound Cleanser Discharge Instruction: Cleanse the wound with wound cleanser prior to applying a clean dressing using gauze sponges, not tissue or cotton balls. Peri-Wound Care Triamcinolone 15 (g) Discharge Instruction: Use triamcinolone 15 (g) as directed Sween Lotion (Moisturizing lotion) Discharge Instruction: Apply moisturizing lotion as directed Topical Primary Dressing IODOFLEX 0.9% Cadexomer Iodine Pad 4x6 cm Discharge Instruction: Apply Iodoflex or Iodosorb to wound bed as instructed Secondary Dressing ABD Pad, 8x10 Discharge Instruction: Apply over primary dressing as directed. Zetuvit Plus 4x8 in Discharge Instruction: Apply over primary dressing as directed. Secured With Compression Wrap ThreePress (3 layer compression wrap) Discharge Instruction: Apply three layer compression as directed. Compression Stockings Add-Ons Electronic Signature(s) Signed: 03/19/2020 5:55:15 PM By: Zenaida Deed RN, BSN Signed: 03/19/2020 5:55:20 PM By: Shawn Stall Entered By: Zenaida Deed on 03/19/2020 16:03:43 -------------------------------------------------------------------------------- Wound Assessment Details Patient Name: Date of Service: Raymond Clara [place], [name] 03/19/2020 2:45 PM Medical Record Number: [id number] Patient Account Number: [id number] Date of Birth/Sex: Treating RN: 1970-05-16 (49 y.o. Raymond Gonzalez Primary Care Clent Damore: Bertram Denver Other Clinician: Referring Frida Wahlstrom: Treating Bemnet Trovato/Extender: Davene Costain, Fredia Beets in Treatment: 2 Wound Status Wound Number: 2 Primary Etiology: Venous Leg Ulcer Wound Location: Left, Proximal, Lateral Lower Leg Wound Status: Open Wounding Event: Trauma Comorbid History: Asthma, Hypertension Date Acquired: 07/28/2018 Weeks  Of Treatment: 2 Clustered Wound: No Wound Measurements Length: (cm) 2.5 Width: (cm) 2.5 Depth: (cm) 0.1 Area: (cm) 4.909 Volume: (cm) 0.491 % Reduction in Area: 9.4% % Reduction in Volume: 54.7% Epithelialization: None Tunneling: No Undermining: No Wound Description Classification: Full Thickness Without Exposed Support Structures Wound Margin: Flat and Intact Exudate Amount: Medium Exudate Type: Serosanguineous Exudate Color: red, brown Foul Odor After Cleansing: No Slough/Fibrino Yes Wound Bed Granulation Amount: Large (67-100%) Exposed Structure Granulation Quality: Red Fascia Exposed: No Necrotic Amount: Small (1-33%) Fat Layer (Subcutaneous Tissue) Exposed: Yes Necrotic Quality: Adherent Slough Tendon Exposed: No Muscle Exposed: No Joint Exposed: No Bone Exposed: No Treatment Notes Wound #2 (Lower Leg) Wound Laterality: Left, Lateral, Proximal Cleanser Soap and Water Discharge Instruction: May shower and wash wound with dial antibacterial soap and water  prior to dressing change. Wound Cleanser Discharge Instruction: Cleanse the wound with wound cleanser prior to applying a clean dressing using gauze sponges, not tissue or cotton balls. Peri-Wound Care Triamcinolone 15 (g) Discharge Instruction: Use triamcinolone 15 (g) as directed Sween Lotion (Moisturizing lotion) Discharge Instruction: Apply moisturizing lotion as directed Topical Primary Dressing IODOFLEX 0.9% Cadexomer Iodine Pad 4x6 cm Discharge Instruction: Apply Iodoflex or Iodosorb to wound bed as instructed Secondary Dressing ABD Pad, 8x10 Discharge Instruction: Apply over primary dressing as directed. Zetuvit Plus 4x8 in Discharge Instruction: Apply over primary dressing as directed. Secured With Compression Wrap ThreePress (3 layer compression wrap) Discharge Instruction: Apply three layer compression as directed. Compression Stockings Add-Ons Electronic Signature(s) Signed: 03/19/2020  5:55:15 PM By: Zenaida Deed RN, BSN Signed: 03/19/2020 5:55:20 PM By: Shawn Stall Entered By: Zenaida Deed on 03/19/2020 16:04:32 -------------------------------------------------------------------------------- Wound Assessment Details Patient Name: Date of Service: Raymond Clara [place], [name] 03/19/2020 2:45 PM Medical Record Number: [id number] Patient Account Number: [id number] Date of Birth/Sex: Treating RN: April 26, 1970 (49 y.o. Raymond Gonzalez Primary Care Ranata Laughery: Bertram Denver Other Clinician: Referring Simeon Vera: Treating Marliss Buttacavoli/Extender: Davene Costain, Fredia Beets in Treatment: 2 Wound Status Wound Number: 3 Primary Etiology: Venous Leg Ulcer Wound Location: Left, Anterior Lower Leg Wound Status: Open Wounding Event: Trauma Comorbid History: Asthma, Hypertension Date Acquired: 07/28/2018 Weeks Of Treatment: 2 Clustered Wound: No Wound Measurements Length: (cm) 2.9 Width: (cm) 1.9 Depth: (cm) 0.1 Area: (cm) 4.328 Volume: (cm) 0.433 % Reduction in Area: 23.5% % Reduction in Volume: 61.7% Epithelialization: None Tunneling: No Undermining: No Wound Description Classification: Full Thickness Without Exposed Support Structures Wound Margin: Flat and Intact Exudate Amount: Medium Exudate Type: Serosanguineous Exudate Color: red, brown Foul Odor After Cleansing: No Slough/Fibrino Yes Wound Bed Granulation Amount: Large (67-100%) Exposed Structure Granulation Quality: Red Fascia Exposed: No Necrotic Amount: Small (1-33%) Fat Layer (Subcutaneous Tissue) Exposed: Yes Necrotic Quality: Adherent Slough Tendon Exposed: No Muscle Exposed: No Joint Exposed: No Bone Exposed: No Treatment Notes Wound #3 (Lower Leg) Wound Laterality: Left, Anterior Cleanser Soap and Water Discharge Instruction: May shower and wash wound with dial antibacterial soap and water prior to dressing change. Wound Cleanser Discharge Instruction: Cleanse the wound with wound  cleanser prior to applying a clean dressing using gauze sponges, not tissue or cotton balls. Peri-Wound Care Triamcinolone 15 (g) Discharge Instruction: Use triamcinolone 15 (g) as directed Sween Lotion (Moisturizing lotion) Discharge Instruction: Apply moisturizing lotion as directed Topical Primary Dressing IODOFLEX 0.9% Cadexomer Iodine Pad 4x6 cm Discharge Instruction: Apply Iodoflex or Iodosorb to wound bed as instructed Secondary Dressing ABD Pad, 8x10 Discharge Instruction: Apply over primary dressing as directed. Zetuvit Plus 4x8 in Discharge Instruction: Apply over primary dressing as directed. Secured With Compression Wrap ThreePress (3 layer compression wrap) Discharge Instruction: Apply three layer compression as directed. Compression Stockings Add-Ons Electronic Signature(s) Signed: 03/19/2020 5:55:15 PM By: Zenaida Deed RN, BSN Signed: 03/19/2020 5:55:20 PM By: Shawn Stall Entered By: Zenaida Deed on 03/19/2020 16:05:16 -------------------------------------------------------------------------------- Wound Assessment Details Patient Name: Date of Service: Raymond Clara [place], [name] 03/19/2020 2:45 PM Medical Record Number: [id number] Patient Account Number: [id number] Date of Birth/Sex: Treating RN: [birth year] (49 y.o. Raymond Gonzalez Primary Care Encarnacion Bole: Bertram Denver Other Clinician: Referring Damont Balles: Treating Phyliss Hulick/Extender: Neale Burly in Treatment: 2 Wound Status Wound Number: 4 Primary Etiology: Venous Leg Ulcer Wound Location: Left, Distal, Anterior Lower Leg Wound Status: Open Wounding Event: Trauma Comorbid History: Asthma, Hypertension Date Acquired: 07/28/2018 Weeks Of Treatment: 2 Clustered  Wound: No Wound Measurements Length: (cm) 2.5 Width: (cm) 1.5 Depth: (cm) 0.1 Area: (cm) 2.945 Volume: (cm) 0.295 % Reduction in Area: 50% % Reduction in Volume: 49.9% Epithelialization: None Tunneling:  No Undermining: No Wound Description Classification: Full Thickness Without Exposed Support Structures Wound Margin: Flat and Intact Exudate Amount: Medium Exudate Type: Serosanguineous Exudate Color: red, brown Wound Bed Granulation Amount: Large (67-100%) Granulation Quality: Red Necrotic Amount: Small (1-33%) Necrotic Quality: Adherent Slough Foul Odor After Cleansing: No Slough/Fibrino Yes Exposed Structure Fascia Exposed: No Fat Layer (Subcutaneous Tissue) Exposed: Yes Tendon Exposed: No Muscle Exposed: No Joint Exposed: No Bone Exposed: No Treatment Notes Wound #4 (Lower Leg) Wound Laterality: Left, Anterior, Distal Cleanser Soap and Water Discharge Instruction: May shower and wash wound with dial antibacterial soap and water prior to dressing change. Wound Cleanser Discharge Instruction: Cleanse the wound with wound cleanser prior to applying a clean dressing using gauze sponges, not tissue or cotton balls. Peri-Wound Care Triamcinolone 15 (g) Discharge Instruction: Use triamcinolone 15 (g) as directed Sween Lotion (Moisturizing lotion) Discharge Instruction: Apply moisturizing lotion as directed Topical Primary Dressing IODOFLEX 0.9% Cadexomer Iodine Pad 4x6 cm Discharge Instruction: Apply Iodoflex or Iodosorb to wound bed as instructed Secondary Dressing ABD Pad, 8x10 Discharge Instruction: Apply over primary dressing as directed. Zetuvit Plus 4x8 in Discharge Instruction: Apply over primary dressing as directed. Secured With Compression Wrap ThreePress (3 layer compression wrap) Discharge Instruction: Apply three layer compression as directed. Compression Stockings Add-Ons Electronic Signature(s) Signed: 03/19/2020 5:55:15 PM By: Zenaida Deed RN, BSN Signed: 03/19/2020 5:55:20 PM By: Shawn Stall Entered By: Zenaida Deed on 03/19/2020 16:05:42 -------------------------------------------------------------------------------- Wound Assessment  Details Patient Name: Date of Service: Raymond Clara NE, [name] 03/19/2020 2:45 PM Medical Record Number: [id number] Patient Account Number: [id number] Date of Birth/Sex: Treating RN: 1970-06-28 (49 y.o. Raymond Gonzalez Primary Care Tesla Keeler: Bertram Denver Other Clinician: Referring Leean Amezcua: Treating Laysha Childers/Extender: Davene Costain, Fredia Beets in Treatment: 2 Wound Status Wound Number: 5 Primary Etiology: Venous Leg Ulcer Wound Location: Left, Distal, Lateral Lower Leg Wound Status: Open Wounding Event: Trauma Comorbid History: Asthma, Hypertension Date Acquired: 07/28/2018 Weeks Of Treatment: 2 Clustered Wound: No Wound Measurements Length: (cm) 3 Width: (cm) 1.5 Depth: (cm) 0.1 Area: (cm) 3.534 Volume: (cm) 0.353 % Reduction in Area: 55% % Reduction in Volume: 55% Epithelialization: None Tunneling: No Undermining: No Wound Description Classification: Full Thickness Without Exposed Support Structures Wound Margin: Flat and Intact Exudate Amount: Medium Exudate Type: Serosanguineous Exudate Color: red, brown Foul Odor After Cleansing: No Slough/Fibrino Yes Wound Bed Granulation Amount: Medium (34-66%) Exposed Structure Granulation Quality: Red, Pink, Pale Fascia Exposed: No Necrotic Amount: Medium (34-66%) Fat Layer (Subcutaneous Tissue) Exposed: Yes Necrotic Quality: Adherent Slough Tendon Exposed: No Muscle Exposed: No Joint Exposed: No Bone Exposed: No Treatment Notes Wound #5 (Lower Leg) Wound Laterality: Left, Lateral, Distal Cleanser Soap and Water Discharge Instruction: May shower and wash wound with dial antibacterial soap and water prior to dressing change. Wound Cleanser Discharge Instruction: Cleanse the wound with wound cleanser prior to applying a clean dressing using gauze sponges, not tissue or cotton balls. Peri-Wound Care Triamcinolone 15 (g) Discharge Instruction: Use triamcinolone 15 (g) as directed Sween Lotion (Moisturizing  lotion) Discharge Instruction: Apply moisturizing lotion as directed Topical Primary Dressing IODOFLEX 0.9% Cadexomer Iodine Pad 4x6 cm Discharge Instruction: Apply Iodoflex or Iodosorb to wound bed as instructed Secondary Dressing ABD Pad, 8x10 Discharge Instruction: Apply over primary dressing as directed. Zetuvit Plus 4x8 in Discharge Instruction: Apply over  primary dressing as directed. Secured With Compression Wrap ThreePress (3 layer compression wrap) Discharge Instruction: Apply three layer compression as directed. Compression Stockings Add-Ons Electronic Signature(s) Signed: 03/19/2020 5:55:15 PM By: Zenaida Deed RN, BSN Signed: 03/19/2020 5:55:20 PM By: Shawn Stall Entered By: Zenaida Deed on 03/19/2020 16:06:08 -------------------------------------------------------------------------------- Wound Assessment Details Patient Name: Date of Service: Raymond Clara NE, [name] 03/19/2020 2:45 PM Medical Record Number: [id number] Patient Account Number: [id number] Date of Birth/Sex: Treating RN: 04/07/1970 (49 y.o. Raymond Gonzalez Primary Care Adin Lariccia: Bertram Denver Other Clinician: Referring Andrej Spagnoli: Treating Denarius Sesler/Extender: Davene Costain, Fredia Beets in Treatment: 2 Wound Status Wound Number: 6 Primary Etiology: Venous Leg Ulcer Wound Location: Left, Distal, Medial Lower Leg Wound Status: Open Wounding Event: Trauma Comorbid History: Asthma, Hypertension Date Acquired: 07/28/2018 Weeks Of Treatment: 2 Clustered Wound: No Wound Measurements Length: (cm) 1.8 Width: (cm) 1.7 Depth: (cm) 0.1 Area: (cm) 2.403 Volume: (cm) 0.24 % Reduction in Area: -2% % Reduction in Volume: -1.7% Epithelialization: None Tunneling: No Undermining: No Wound Description Classification: Full Thickness Without Exposed Support Structures Wound Margin: Flat and Intact Exudate Amount: Medium Exudate Type: Serosanguineous Exudate Color: red, brown Foul Odor After  Cleansing: No Slough/Fibrino Yes Wound Bed Granulation Amount: Large (67-100%) Exposed Structure Granulation Quality: Red Fascia Exposed: No Necrotic Amount: Small (1-33%) Fat Layer (Subcutaneous Tissue) Exposed: Yes Necrotic Quality: Adherent Slough Tendon Exposed: No Muscle Exposed: No Joint Exposed: No Bone Exposed: No Treatment Notes Wound #6 (Lower Leg) Wound Laterality: Left, Medial, Distal Cleanser Soap and Water Discharge Instruction: May shower and wash wound with dial antibacterial soap and water prior to dressing change. Wound Cleanser Discharge Instruction: Cleanse the wound with wound cleanser prior to applying a clean dressing using gauze sponges, not tissue or cotton balls. Peri-Wound Care Triamcinolone 15 (g) Discharge Instruction: Use triamcinolone 15 (g) as directed Sween Lotion (Moisturizing lotion) Discharge Instruction: Apply moisturizing lotion as directed Topical Primary Dressing IODOFLEX 0.9% Cadexomer Iodine Pad 4x6 cm Discharge Instruction: Apply Iodoflex or Iodosorb to wound bed as instructed Secondary Dressing ABD Pad, 8x10 Discharge Instruction: Apply over primary dressing as directed. Zetuvit Plus 4x8 in Discharge Instruction: Apply over primary dressing as directed. Secured With Compression Wrap ThreePress (3 layer compression wrap) Discharge Instruction: Apply three layer compression as directed. Compression Stockings Add-Ons Electronic Signature(s) Signed: 03/19/2020 5:55:15 PM By: Zenaida Deed RN, BSN Signed: 03/19/2020 5:55:20 PM By: Shawn Stall Entered By: Zenaida Deed on 03/19/2020 16:06:32 -------------------------------------------------------------------------------- Wound Assessment Details Patient Name: Date of Service: Raymond Clara NE, [name] 03/19/2020 2:45 PM Medical Record Number: [id number] Patient Account Number: [id number] Date of Birth/Sex: Treating RN: Jan 24, 1971 (49 y.o. Raymond Gonzalez Primary Care Janit Cutter:  Bertram Denver Other Clinician: Referring Abdulai Blaylock: Treating Rashaad Hallstrom/Extender: Neale Burly in Treatment: 2 Wound Status Wound Number: 7 Primary Etiology: Trauma, Other Wound Location: Right Foot Wound Status: Open Wounding Event: Trauma Comorbid History: Asthma, Hypertension Date Acquired: 03/12/2020 Weeks Of Treatment: 1 Clustered Wound: No Wound Measurements Length: (cm) 0.2 Width: (cm) 0.2 Depth: (cm) 0.3 Area: (cm) 0.031 Volume: (cm) 0.009 % Reduction in Area: 0% % Reduction in Volume: -50% Epithelialization: None Tunneling: Yes Position (o'clock): 12 Maximum Distance: (cm) 1.2 Undermining: No Wound Description Classification: Full Thickness Without Exposed Support Structures Wound Margin: Well defined, not attached Exudate Amount: Small Exudate Type: Purulent Exudate Color: yellow, brown, green Foul Odor After Cleansing: No Slough/Fibrino No Wound Bed Granulation Amount: None Present (0%) Exposed Structure Necrotic Amount: None Present (0%) Fascia Exposed: No Fat Layer (Subcutaneous Tissue)  Exposed: No Tendon Exposed: No Muscle Exposed: No Joint Exposed: No Bone Exposed: No Treatment Notes Wound #7 (Foot) Wound Laterality: Right Cleanser Peri-Wound Care Topical Primary Dressing KerraCel Ag Gelling Fiber Dressing, 2x2 in (silver alginate) Discharge Instruction: Apply silver alginate to wound bed as instructed Secondary Dressing Woven Gauze Sponges 2x2 in Discharge Instruction: Apply over primary dressing as directed. Secured With 31M Medipore H Soft Cloth Surgical T 4 x 2 (in/yd) ape Discharge Instruction: Secure dressing with tape as directed. Compression Wrap Compression Stockings Add-Ons Electronic Signature(s) Signed: 03/19/2020 5:55:15 PM By: Zenaida DeedBoehlein, Linda RN, BSN Entered By: Zenaida DeedBoehlein, Linda on 03/19/2020 16:07:20 -------------------------------------------------------------------------------- Vitals  Details Patient Name: Date of Service: Raymond ClaraBO O NE, [name] 03/19/2020 2:45 PM Medical Record Number: [id number] Patient Account Number: [id number] Date of Birth/Sex: Treating RN: 1970-03-25 (49 y.o. Raymond SoursM) Deaton, Bobbi Primary Care Navayah Sok: Bertram DenverFleming, Zelda Other Clinician: Referring Adrijana Haros: Treating Aubert Choyce/Extender: Neale Burlyobson, Michael Fleming, Zelda Weeks in Treatment: 2 Vital Signs Time Taken: 15:19 Temperature (F): 97.4 Height (in): 73 Pulse (bpm): 82 Weight (lbs): 280 Respiratory Rate (breaths/min): 18 Body Mass Index (BMI): 36.9 Blood Pressure (mmHg): 152/72 Reference Range: 80 - 120 mg / dl Electronic Signature(s) Signed: 03/20/2020 12:40:32 PM By: Karl Itoawkins, Destiny Entered By: Karl Itoawkins, Destiny on 03/19/2020 15:19:42

## 2020-03-20 NOTE — Progress Notes (Signed)
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 Apply over primary dressing as directed. Com pression Wrap: ThreePress (3 layer compression wrap) 1 x Per Week/7 Days Discharge Instructions: Apply three layer compression as directed. WOUND #2: - Lower Leg Wound Laterality: Left, Lateral, Proximal Cleanser: Soap and Water 1 x Per Week/7 Days Discharge Instructions: May shower and wash wound with dial antibacterial soap and water prior to dressing change. Cleanser: Wound Cleanser 1 x Per Week/7 Days Discharge Instructions: Cleanse the wound with wound cleanser prior to applying a clean dressing using gauze sponges, not tissue or cotton balls. Peri-Wound Care: Triamcinolone 15 (g) 1 x Per Week/7 Days Discharge Instructions: Use triamcinolone 15 (g) as directed Peri-Wound Care: Sween Lotion (Moisturizing lotion) 1 x Per Week/7 Days Discharge Instructions: Apply moisturizing lotion as directed Prim Dressing: IODOFLEX 0.9% Cadexomer Iodine Pad 4x6 cm 1 x Per Week/7 Days ary Discharge Instructions: Apply Iodoflex or  Iodosorb to wound bed as instructed Secondary Dressing: ABD Pad, 8x10 1 x Per Week/7 Days Discharge Instructions: Apply over primary dressing as directed. Secondary Dressing: Zetuvit Plus 4x8 in 1 x Per Week/7 Days Discharge Instructions: Apply over primary dressing as directed. Com pression Wrap: ThreePress (3 layer compression wrap) 1 x Per Week/7 Days Discharge Instructions: Apply three layer compression as directed. WOUND #3: - Lower Leg Wound Laterality: Left, Anterior Cleanser: Soap and Water 1 x Per Week/7 Days Discharge Instructions: May shower and wash wound with dial antibacterial soap and water prior to dressing change. Cleanser: Wound Cleanser 1 x Per Week/7 Days Discharge Instructions: Cleanse the wound with wound cleanser prior to applying a clean dressing using gauze sponges, not tissue or cotton balls. Peri-Wound Care: Triamcinolone 15 (g) 1 x Per Week/7 Days Discharge Instructions: Use triamcinolone 15 (g) as directed Peri-Wound Care: Sween Lotion (Moisturizing lotion) 1 x Per Week/7 Days Discharge Instructions: Apply moisturizing lotion as directed Prim Dressing: IODOFLEX 0.9% Cadexomer Iodine Pad 4x6 cm 1 x Per Week/7 Days ary Discharge Instructions: Apply Iodoflex or Iodosorb to wound bed as instructed Secondary Dressing: ABD Pad, 8x10 1 x Per Week/7 Days Discharge Instructions: Apply over primary dressing as directed. Secondary Dressing: Zetuvit Plus 4x8 in 1 x Per Week/7 Days Discharge Instructions: Apply over primary dressing as directed. Com pression Wrap: ThreePress (3 layer compression wrap) 1 x Per Week/7 Days Discharge Instructions: Apply three layer compression as directed. WOUND #4: - Lower Leg Wound Laterality: Left, Anterior, Distal Cleanser: Soap and  Water 1 x Per Week/7 Days Discharge Instructions: May shower and wash wound with dial antibacterial soap and water prior to dressing change. Cleanser: Wound Cleanser 1 x Per Week/7 Days Discharge  Instructions: Cleanse the wound with wound cleanser prior to applying a clean dressing using gauze sponges, not tissue or cotton balls. Peri-Wound Care: Triamcinolone 15 (g) 1 x Per Week/7 Days Discharge Instructions: Use triamcinolone 15 (g) as directed Peri-Wound Care: Sween Lotion (Moisturizing lotion) 1 x Per Week/7 Days Discharge Instructions: Apply moisturizing lotion as directed Prim Dressing: IODOFLEX 0.9% Cadexomer Iodine Pad 4x6 cm 1 x Per Week/7 Days ary Discharge Instructions: Apply Iodoflex or Iodosorb to wound bed as instructed Secondary Dressing: ABD Pad, 8x10 1 x Per Week/7 Days Discharge Instructions: Apply over primary dressing as directed. Secondary Dressing: Zetuvit Plus 4x8 in 1 x Per Week/7 Days Discharge Instructions: Apply over primary dressing as directed. Com pression Wrap: ThreePress (3 layer compression wrap) 1 x Per Week/7 Days Discharge Instructions: Apply three layer compression as directed. WOUND #5: - Lower Leg Wound Laterality: Left, Lateral, Distal Cleanser: Soap and Water 1 x Per Week/7 Days Discharge Instructions: May shower and wash wound with dial antibacterial soap and water prior to dressing change. Cleanser: Wound Cleanser 1 x Per Week/7 Days Discharge Instructions: Cleanse the wound with wound cleanser prior to applying a clean dressing using gauze sponges, not tissue or cotton balls. Peri-Wound Care: Triamcinolone 15 (g) 1 x Per Week/7 Days Discharge Instructions: Use triamcinolone 15 (g) as directed Peri-Wound Care: Sween Lotion (Moisturizing lotion) 1 x Per Week/7 Days Discharge Instructions: Apply moisturizing lotion as directed Prim Dressing: IODOFLEX 0.9% Cadexomer Iodine Pad 4x6 cm 1 x Per Week/7 Days ary Discharge Instructions: Apply Iodoflex or Iodosorb to wound bed as instructed Secondary Dressing: ABD Pad, 8x10 1 x Per Week/7 Days Discharge Instructions: Apply over primary dressing as directed. Secondary Dressing: Zetuvit Plus 4x8 in 1  x Per Week/7 Days Discharge Instructions: Apply over primary dressing as directed. Com pression Wrap: ThreePress (3 layer compression wrap) 1 x Per Week/7 Days Discharge Instructions: Apply three layer compression as directed. WOUND #6: - Lower Leg Wound Laterality: Left, Medial, Distal Cleanser: Soap and Water 1 x Per Week/7 Days Discharge Instructions: May shower and wash wound with dial antibacterial soap and water prior to dressing change. Cleanser: Wound Cleanser 1 x Per Week/7 Days Discharge Instructions: Cleanse the wound with wound cleanser prior to applying a clean dressing using gauze sponges, not tissue or cotton balls. Peri-Wound Care: Triamcinolone 15 (g) 1 x Per Week/7 Days Discharge Instructions: Use triamcinolone 15 (g) as directed Peri-Wound Care: Sween Lotion (Moisturizing lotion) 1 x Per Week/7 Days Discharge Instructions: Apply moisturizing lotion as directed Prim Dressing: IODOFLEX 0.9% Cadexomer Iodine Pad 4x6 cm 1 x Per Week/7 Days ary Discharge Instructions: Apply Iodoflex or Iodosorb to wound bed as instructed Secondary Dressing: ABD Pad, 8x10 1 x Per Week/7 Days Discharge Instructions: Apply over primary dressing as directed. Secondary Dressing: Zetuvit Plus 4x8 in 1 x Per Week/7 Days Discharge Instructions: Apply over primary dressing as directed. Com pression Wrap: ThreePress (3 layer compression wrap) 1 x Per Week/7 Days Discharge Instructions: Apply three layer compression as directed. WOUND #7: - Foot Wound Laterality: Right Prim Dressing: KerraCel Ag Gelling Fiber Dressing, 2x2 in (silver alginate) 1 x Per Day/30 Days ary Discharge Instructions: Apply silver alginate to wound bed as instructed Secondary Dressing: Woven Gauze Sponges 2x2 in 1 x Per Day/30 Days Discharge Instructions: Apply over primary dressing as  directed. Secured With: 79M Medipore H Soft Cloth Surgical T 4 x 2 (in/yd) 1 x Per Day/30 Days ape Discharge Instructions: Secure dressing with  tape as directed. 1. We continued with the Iodoflex 3 layer compression 2. I put some silver alginate on the area on the right foot with a border foam 3. He has Levaquin at home left over from a previous treatment he is going to take this every day. He would not let me debride the area or explore to see if there is a foreign body. He may have been developing a cellulitis here which was my other thought Electronic Signature(s) Signed: 03/20/2020 2:27:07 PM By: Linton Ham MD Entered By: Linton Ham on 03/19/2020 16:32:22 -------------------------------------------------------------------------------- SuperBill Details Patient Name: Date of Service: [place], Gonzalez TTHEW 03/19/2020 Medical Record Number: [id number] Patient Account Number: [id number] Date of Birth/Sex: Treating RN: March 22, 1971 (49 y.o. Ernestene Mention Primary Care Provider: Geryl Gonzalez Other Clinician: Referring Provider: Treating Provider/Extender: Lenetta Quaker in Treatment: 2 Diagnosis Coding ICD-10 Codes Code Description [phone] Non-pressure chronic ulcer of other part of left lower leg limited to breakdown of skin I87.332 Chronic venous hypertension (idiopathic) with ulcer and inflammation of left lower extremity Facility Procedures CPT4 Code: [id number] Description: (Facility Use Only) [phone] - [facility] Modifier: Quantity: 1 Physician Procedures Electronic Signature(s) Signed: 03/20/2020 2:27:07 PM By: Linton Ham MD Entered By: Linton Ham on 03/19/2020 16:32:42

## 2020-03-26 ENCOUNTER — Encounter (HOSPITAL_BASED_OUTPATIENT_CLINIC_OR_DEPARTMENT_OTHER): Payer: Self-pay | Admitting: Internal Medicine

## 2020-03-30 ENCOUNTER — Encounter (HOSPITAL_BASED_OUTPATIENT_CLINIC_OR_DEPARTMENT_OTHER): Payer: Self-pay | Admitting: Internal Medicine

## 2020-04-06 ENCOUNTER — Encounter (HOSPITAL_BASED_OUTPATIENT_CLINIC_OR_DEPARTMENT_OTHER): Payer: Medicaid Other | Attending: Internal Medicine | Admitting: Internal Medicine

## 2020-04-30 ENCOUNTER — Encounter (HOSPITAL_BASED_OUTPATIENT_CLINIC_OR_DEPARTMENT_OTHER): Payer: Medicaid Other | Attending: Internal Medicine | Admitting: Internal Medicine

## 2020-11-05 ENCOUNTER — Encounter (HOSPITAL_BASED_OUTPATIENT_CLINIC_OR_DEPARTMENT_OTHER): Payer: Self-pay

## 2020-11-05 ENCOUNTER — Other Ambulatory Visit: Payer: Self-pay

## 2020-11-05 ENCOUNTER — Emergency Department (HOSPITAL_BASED_OUTPATIENT_CLINIC_OR_DEPARTMENT_OTHER)
Admission: EM | Admit: 2020-11-05 | Discharge: 2020-11-05 | Disposition: A | Discharge status: Home / Self Care | Payer: Medicaid Other | Attending: Emergency Medicine | Admitting: Emergency Medicine

## 2020-11-05 DIAGNOSIS — J45909 Unspecified asthma, uncomplicated: Secondary | ICD-10-CM | POA: Insufficient documentation

## 2020-11-05 DIAGNOSIS — L519 Erythema multiforme, unspecified: Secondary | ICD-10-CM | POA: Insufficient documentation

## 2020-11-05 DIAGNOSIS — F17210 Nicotine dependence, cigarettes, uncomplicated: Secondary | ICD-10-CM | POA: Insufficient documentation

## 2020-11-05 DIAGNOSIS — M79605 Pain in left leg: Secondary | ICD-10-CM | POA: Insufficient documentation

## 2020-11-05 DIAGNOSIS — L0390 Cellulitis, unspecified: Secondary | ICD-10-CM | POA: Insufficient documentation

## 2020-11-05 MED ORDER — DOXYCYCLINE HYCLATE 100 MG PO TABS
100.0000 mg | ORAL_TABLET | Freq: Two times a day (BID) | ORAL | 0 refills | Status: AC
  Filled 2020-11-05: qty 20, 10d supply, fill #0

## 2020-11-05 MED ORDER — TRIAMCINOLONE ACETONIDE 0.1 % EX CREA
1.0000 "application " | TOPICAL_CREAM | Freq: Two times a day (BID) | CUTANEOUS | 0 refills | Status: DC
  Filled 2020-11-05: qty 30, 15d supply, fill #0

## 2020-11-05 NOTE — ED Triage Notes (Signed)
Reports left leg pain which appears reddish purple and has been battling cellulitis per patient.  Also report spider bite/abcess to left side with hives on back.

## 2020-11-05 NOTE — Discharge Instructions (Addendum)
You were evaluated in the Emergency Department and after careful evaluation, we did not find any emergent condition requiring admission or further testing in the hospital.  Your exam/testing today was overall reassuring.  Rash may be due to erythema multiforme.  Recommend follow-up with dermatology.  We are covering for possible skin infection of the leg with doxycycline, take as directed.  Can use the triamcinolone steroid cream on the rash on your back to help with symptoms.  Please return to the Emergency Department if you experience any worsening of your condition.  Thank you for allowing [place] to be a part of your care.

## 2020-11-05 NOTE — ED Provider Notes (Signed)
[facility] Emergency Department Provider Note MRN:  [id number]  Arrival date & time: 11/05/20     Chief Complaint   Leg Pain (Also complains of abscess to left side and hives)   History of Present Illness   Raymond Gonzalez is a 50 y.o. year-old male with a history of asthma presenting to the ED with chief complaint of leg pain.  Patient has chronic leg swelling and wounds to the legs for over a year.  Having some increased pain and redness to the back of the left leg over the past few days.  Has also noticed a new rash to his back and his side.  Thought maybe he had a spider bite.  Denies fever, no oral lesions, no chest pain or shortness of breath, no abdominal pain.  Review of Systems  A complete 10 system review of systems was obtained and all systems are negative except as noted in the HPI and PMH.   Patient's Health History    Past Medical History:  Diagnosis Date   Asthma    Prediabetes     Past Surgical History:  Procedure Laterality Date   NO PAST SURGERIES      Family History  Problem Relation Age of Onset   Depression Neg Hx     Social History   Socioeconomic History   Marital status: Legally Separated    Spouse name: Not on file   Number of children: Not on file   Years of education: Not on file   Highest education level: Not on file  Occupational History   Not on file  Tobacco Use   Smoking status: Every Day    Packs/day: 1.50    Types: Cigarettes   Smokeless tobacco: Never  Substance and Sexual Activity   Alcohol use: No   Drug use: Yes    Types: Marijuana   Sexual activity: Not Currently  Other Topics Concern   Not on file  Social History Narrative   Not on file   Social Determinants of Health   Financial Resource Strain: Not on file  Food Insecurity: Not on file  Transportation Needs: Not on file  Physical Activity: Not on file  Stress: Not on file  Social Connections: Not on file  Intimate Partner Violence:  Not on file     Physical Exam   Vitals:   11/05/20 0144 11/05/20 0245  BP: (!) 147/91 (!) 156/93  Pulse: 81 82  Resp: 20 20  Temp: 98.3 F (36.8 C)   SpO2: 98% 98%    CONSTITUTIONAL: Well-appearing, NAD NEURO:  Alert and oriented x 3, no focal deficits EYES:  eyes equal and reactive ENT/NECK:  no LAD, no JVD CARDIO: Regular rate, well-perfused, normal S1 and S2 PULM:  CTAB no wheezing or rhonchi GI/GU:  normal bowel sounds, non-distended, non-tender MSK/SPINE:  No gross deformities, no edema SKIN: Diffuse erythema to the bilateral lower extremities with scattered erythematous chronic wounds; diffuse targetoid erythematous macular lesions to the thoracic back PSYCH:  Appropriate speech and behavior Media Information Document Information  Photos    11/05/2020 02:39  Attached To:  Hospital Encounter on 11/05/20   Source Information  Norvel Wenker, Barth Kirks, MD  [facility]   *Additional and/or pertinent findings included in MDM below  Diagnostic and Interventional Summary    EKG Interpretation  Date/Time:    Ventricular Rate:    PR Interval:    QRS Duration:   QT Interval:    QTC Calculation:  R Axis:     Text Interpretation:         Labs Reviewed - No data to display  No orders to display    Medications - No data to display   Procedures  /  Critical Care Procedures  ED Course and Medical Decision Making  I have reviewed the triage vital signs, the nursing notes, and pertinent available records from the EMR.  Listed above are laboratory and imaging tests that I personally ordered, reviewed, and interpreted and then considered in my medical decision making (see below for details).  Patient has multiple skin complaints.  He seems to have chronic lower extremity swelling likely due to venous insufficiency.  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  He has some new pain and redness to the left calf, will cover with doxycycline  to treat for possible cellulitis.  Swelling is largely unchanged, doubt DVT.  No chest pain or shortness of breath.  Patient also has a new rash to his back, targetoid, see picture above.  Denies tick bites.  Considering erythema multiforme versus pityriasis rosea.  No oral lesions, nothing to suggest SJS.  Needs dermatology follow-up but no emergent process at this time, appropriate for discharge, providing triamcinolone.       Barth Kirks. Sedonia Small, [place] [email or web address]  Final Clinical Impressions(s) / ED Diagnoses     ICD-10-CM   1. Erythema multiforme  L51.9     2. Cellulitis, unspecified cellulitis site  L03.90     3. Pain of left lower extremity  M79.605       ED Discharge Orders          Ordered    doxycycline (VIBRAMYCIN) 100 MG capsule  2 times daily        11/05/20 0315    triamcinolone cream (KENALOG) 0.1 %  2 times daily        11/05/20 0315             Discharge Instructions Discussed with and Provided to Patient:    Discharge Instructions      You were evaluated in the Emergency Department and after careful evaluation, we did not find any emergent condition requiring admission or further testing in the hospital.  Your exam/testing today was overall reassuring.  Rash may be due to erythema multiforme.  Recommend follow-up with dermatology.  We are covering for possible skin infection of the leg with doxycycline, take as directed.  Can use the triamcinolone steroid cream on the rash on your back to help with symptoms.  Please return to the Emergency Department if you experience any worsening of your condition.  Thank you for allowing [place] to be a part of your care.        Maudie Flakes, MD 11/05/20 [phone]

## 2021-07-13 ENCOUNTER — Encounter (HOSPITAL_BASED_OUTPATIENT_CLINIC_OR_DEPARTMENT_OTHER): Payer: Self-pay | Admitting: Emergency Medicine

## 2021-07-13 ENCOUNTER — Other Ambulatory Visit: Payer: Self-pay

## 2021-07-13 ENCOUNTER — Emergency Department (HOSPITAL_BASED_OUTPATIENT_CLINIC_OR_DEPARTMENT_OTHER)
Admission: EM | Admit: 2021-07-13 | Discharge: 2021-07-13 | Disposition: A | Discharge status: Home / Self Care | Payer: Commercial Managed Care - HMO | Attending: Emergency Medicine | Admitting: Emergency Medicine

## 2021-07-13 DIAGNOSIS — F17210 Nicotine dependence, cigarettes, uncomplicated: Secondary | ICD-10-CM | POA: Insufficient documentation

## 2021-07-13 DIAGNOSIS — I872 Venous insufficiency (chronic) (peripheral): Secondary | ICD-10-CM | POA: Diagnosis not present

## 2021-07-13 DIAGNOSIS — L03115 Cellulitis of right lower limb: Secondary | ICD-10-CM | POA: Diagnosis not present

## 2021-07-13 DIAGNOSIS — J45909 Unspecified asthma, uncomplicated: Secondary | ICD-10-CM | POA: Insufficient documentation

## 2021-07-13 DIAGNOSIS — M79661 Pain in right lower leg: Secondary | ICD-10-CM | POA: Diagnosis present

## 2021-07-13 MED ORDER — DOXYCYCLINE HYCLATE 100 MG PO CAPS: 100.0000 mg | ORAL_CAPSULE | Freq: Two times a day (BID) | ORAL | 0 refills | Status: AC

## 2021-07-13 NOTE — ED Triage Notes (Signed)
Pt is c/o bilateral leg pain   Pt states the right leg is worse than the left leg  Pt has swelling and redness noted  ?

## 2021-07-13 NOTE — ED Provider Notes (Signed)
?[facility] Emergency Department ?Provider Note ?MRN:  [id number]  ?Arrival date & time: 07/13/21    ? ?Chief Complaint   ?Leg Pain ?  ?History of Present Illness   ?Raymond Gonzalez is a 51 y.o. year-old male with no pertinent past medical presenting to the ED with chief complaint of leg pain. ? ?Years of leg pain, swelling, redness.  Right leg has an area that is more painful and red than normal.  Denies fever.  No other complaints. ? ?Review of Systems  ?A thorough review of systems was obtained and all systems are negative except as noted in the HPI and PMH.  ? ?Patient's Health History   ? ?Past Medical History:  ?Diagnosis Date  ? Asthma   ? Prediabetes   ?  ?Past Surgical History:  ?Procedure Laterality Date  ? NO PAST SURGERIES    ?  ?Family History  ?Problem Relation Age of Onset  ? Depression Neg Hx   ?  ?Social History  ? ?Socioeconomic History  ? Marital status: Legally Separated  ?  Spouse name: Not on file  ? Number of children: Not on file  ? Years of education: Not on file  ? Highest education level: Not on file  ?Occupational History  ? Not on file  ?Tobacco Use  ? Smoking status: Every Day  ?  Packs/day: 0.25  ?  Types: Cigarettes  ? Smokeless tobacco: Never  ?Vaping Use  ? Vaping Use: Never used  ?Substance and Sexual Activity  ? Alcohol use: No  ? Drug use: Yes  ?  Types: Marijuana  ?  Comment: pain pills  ? Sexual activity: Not Currently  ?Other Topics Concern  ? Not on file  ?Social History Narrative  ? Not on file  ? ?Social Determinants of Health  ? ?Financial Resource Strain: Not on file  ?Food Insecurity: Not on file  ?Transportation Needs: Not on file  ?Physical Activity: Not on file  ?Stress: Not on file  ?Social Connections: Not on file  ?Intimate Partner Violence: Not on file  ?  ? ?Physical Exam  ? ?Vitals:  ? 07/13/21 0557  ?BP: (!) 148/92  ?Pulse: 94  ?Resp: 16  ?Temp: 99 ?F (37.2 ?C)  ?SpO2: 97%  ?  ?CONSTITUTIONAL: Chronically ill-appearing,  NAD ?NEURO/PSYCH:  Alert and oriented x 3, no focal deficits ?EYES:  eyes equal and reactive ?ENT/NECK:  no LAD, no JVD ?CARDIO: Regular rate, well-perfused, normal S1 and S2 ?PULM:  CTAB no wheezing or rhonchi ?GI/GU:  non-distended, non-tender ?MSK/SPINE:  No gross deformities, no edema ?SKIN: Diffuse redness and edema to the bilateral lower extremities ? ? ?*Additional and/or pertinent findings included in MDM below ? ?Diagnostic and Interventional Summary  ? ? EKG Interpretation ? ?Date/Time:    ?Ventricular Rate:    ?PR Interval:    ?QRS Duration:   ?QT Interval:    ?QTC Calculation:   ?R Axis:     ?Text Interpretation:   ?  ? ?  ? ?Labs Reviewed - No data to display  ?No orders to display  ?  ?Medications - No data to display  ? ?Procedures  /  Critical Care ?Procedures ? ?ED Course and Medical Decision Making  ?Initial Impression and Ddx ?Documented long history of stasis dermatitis of legs, has an area on the right leg but is more tender and erythematous than normal.  Suspect mild cellulitis.  Nothing to suggest abscess or more significant disease process.  Appropriate for  discharge on antibiotics, referral to wound clinic. ? ?Past medical/surgical history that increases complexity of ED encounter: Venous stasis ? ?Interpretation of Diagnostics ?Not applicable ? ?Patient Reassessment and Ultimate Disposition/Management ?Discharge ? ?Patient management required discussion with the following services or consulting groups:  None ? ?Complexity of Problems Addressed ?Acute illness or injury that poses threat of life of bodily function ? ?Additional Data Reviewed and Analyzed ?Further history obtained from: ?None ? ?Additional Factors Impacting ED Encounter Risk ?Prescriptions ? ?Elmer Sow. Pilar Plate, MD ?[facility] Emergency Medicine ?[facility] [email or web address] ? ?Final Clinical Impressions(s) / ED Diagnoses  ? ?  ICD-10-CM   ?1. Stasis dermatitis of both legs  I87.2   ?  ?2. Cellulitis of right  lower extremity  L03.115   ?  ?  ?ED Discharge Orders   ? ?      Ordered  ?  doxycycline (VIBRAMYCIN) 100 MG capsule  2 times daily       ? 07/13/21 0617  ? ?  ?  ? ?  ?  ? ?Discharge Instructions Discussed with and Provided to Patient:  ? ? ?Discharge Instructions   ? ?  ?You were evaluated in the Emergency Department and after careful evaluation, we did not find any emergent condition requiring admission or further testing in the hospital. ? ?Your exam/testing today was overall reassuring.  Recommend elevating the Lasix is much as he can and and using pressure stockings as we discussed.  Take the doxycycline as directed and follow-up with the wound care clinic. ? ?Please return to the Emergency Department if you experience any worsening of your condition.  Thank you for allowing [place] to be a part of your care. ? ? ? ? ?  ?Sabas Sous, MD ?07/13/21 [phone] ? ?

## 2021-07-13 NOTE — Discharge Instructions (Signed)
You were evaluated in the Emergency Department and after careful evaluation, we did not find any emergent condition requiring admission or further testing in the hospital. ? ?Your exam/testing today was overall reassuring.  Recommend elevating the Lasix is much as he can and and using pressure stockings as we discussed.  Take the doxycycline as directed and follow-up with the wound care clinic. ? ?Please return to the Emergency Department if you experience any worsening of your condition.  Thank you for allowing [place] to be a part of your care. ? ?

## 2021-07-29 IMAGING — US US EXTREM LOW VENOUS*L*
1 series · 14 of 24 positions shown · non-contrast
Comparison: None.

CLINICAL DATA: Pain and swelling. Left lower extremity chronic
wound.

EXAM:
LEFT LOWER EXTREMITY VENOUS DOPPLER ULTRASOUND
TECHNIQUE: Gray-scale sonography with compression, as well as color and duplex
ultrasound, were performed to evaluate the deep venous system(s)
from the level of the common femoral vein through the popliteal and
proximal calf veins.

[Series 1: us extrem low venous*left* · 14 of 36 slices shown]
[im 1/36]
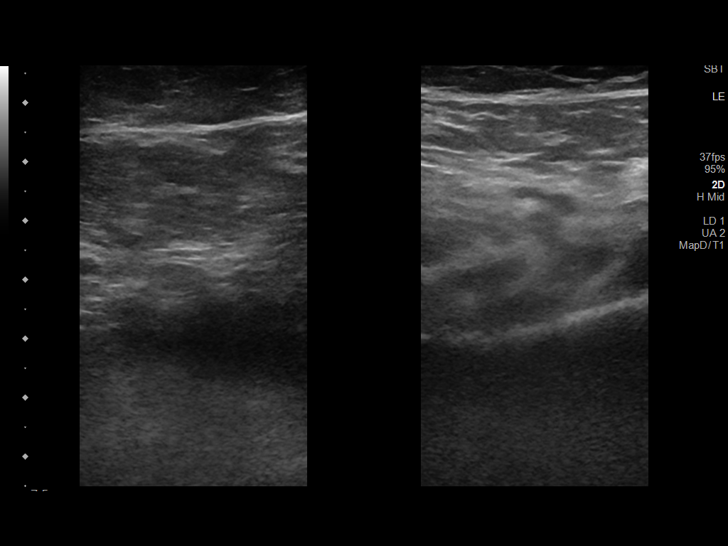
[im 4/36]
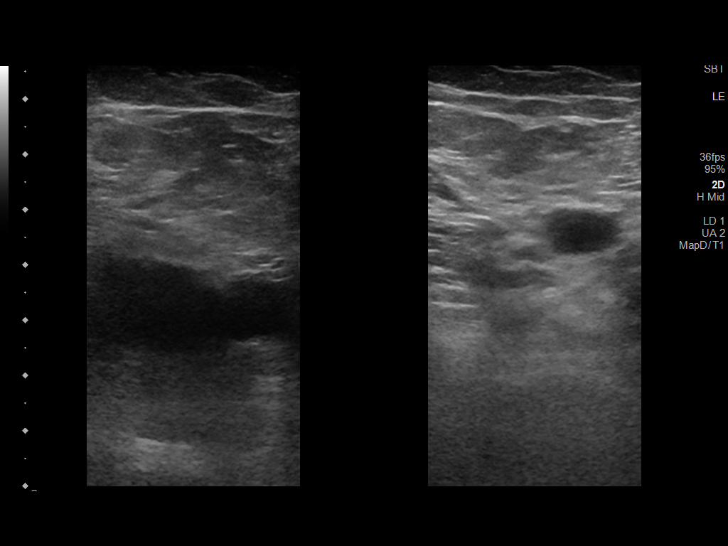
[im 7/36]
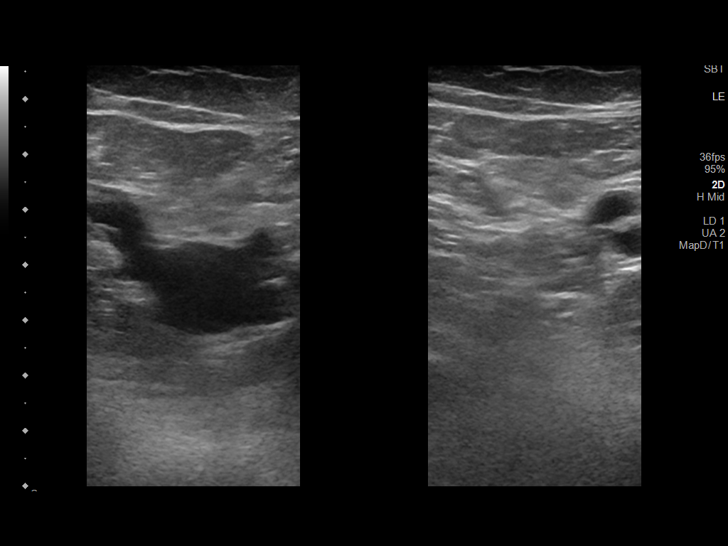
[im 10/36]
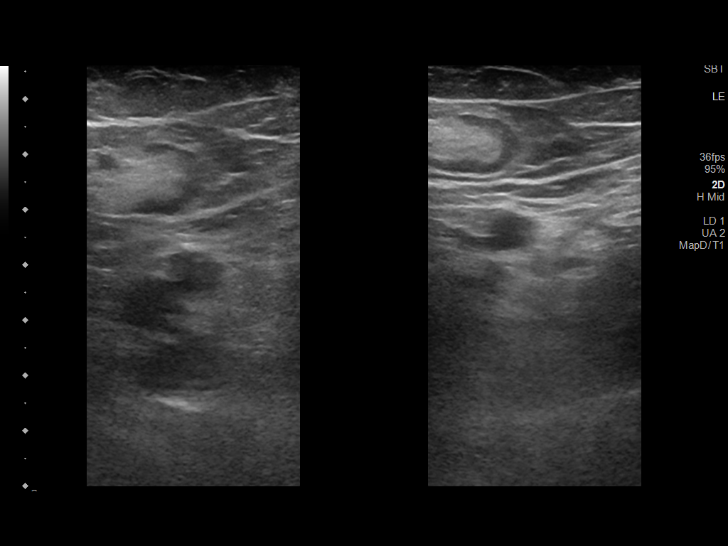
[im 11/36]
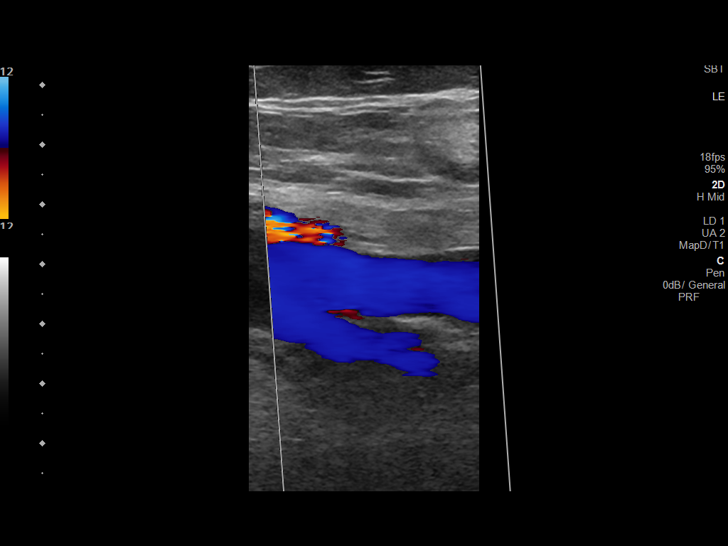
[im 14/36]
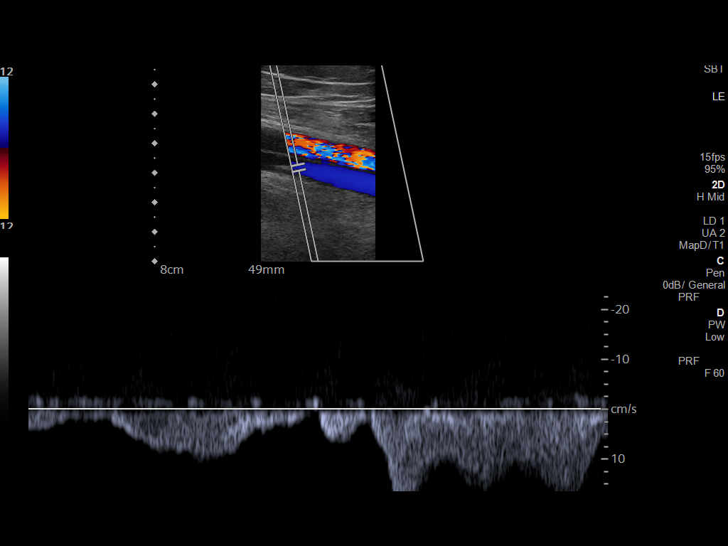
[im 17/36]
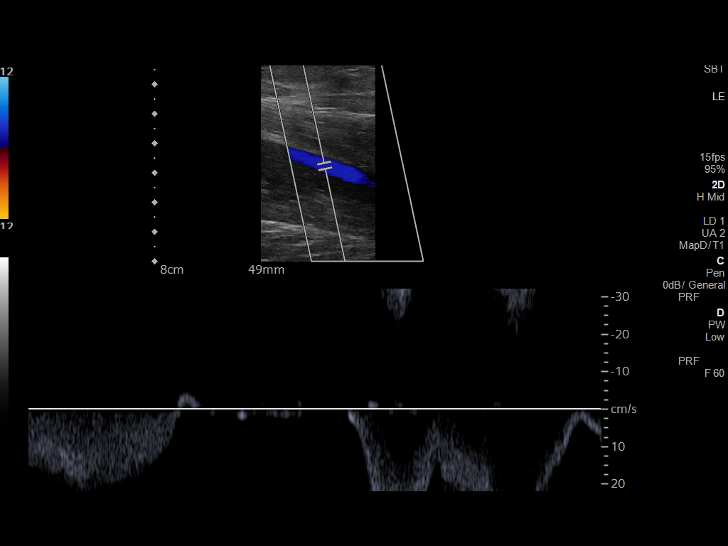
[im 19/36]
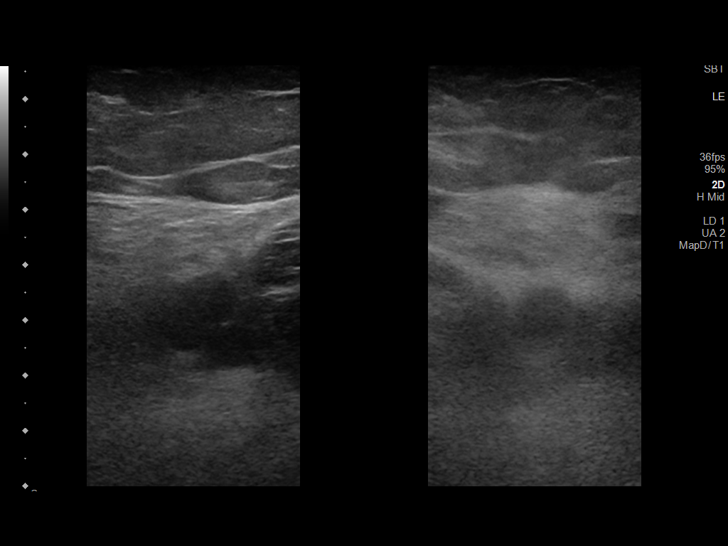
[im 22/36]
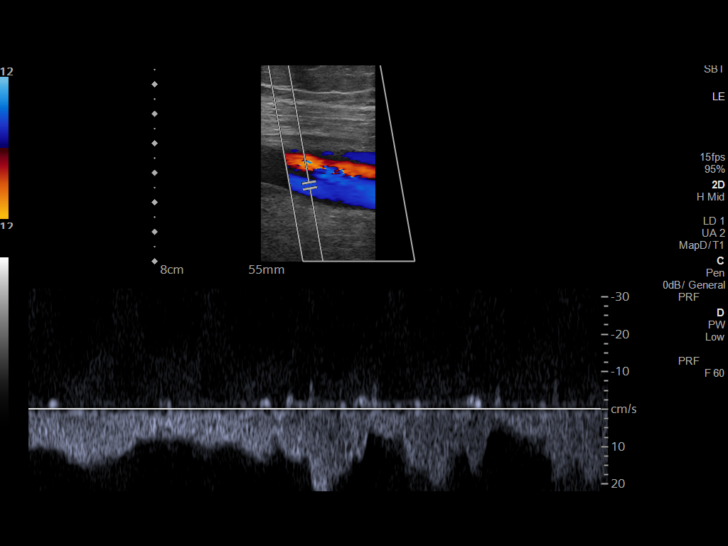
[im 25/36]
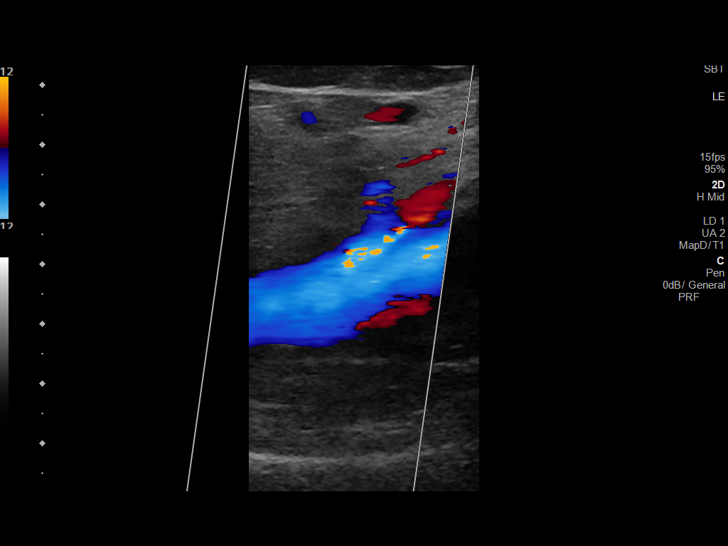
[im 28/36]
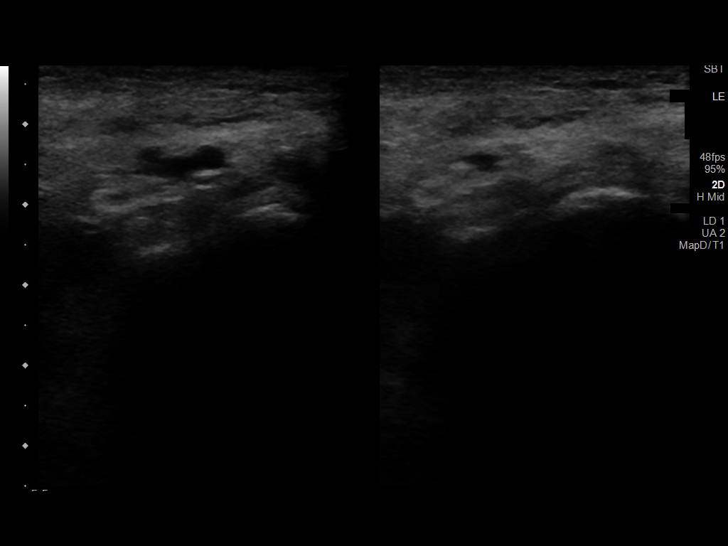
[im 29/36]
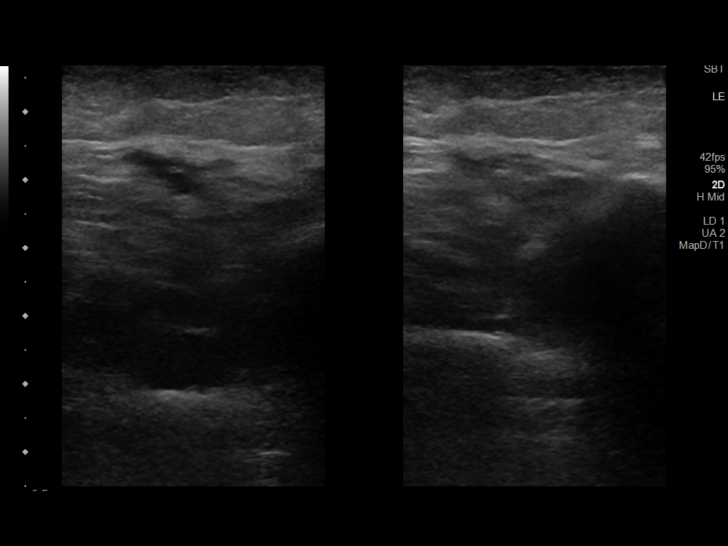
[im 32/36]
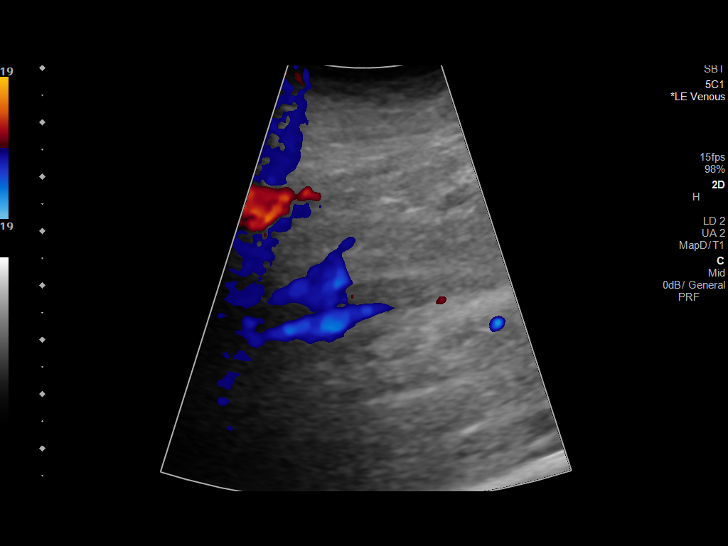
[im 36/36]
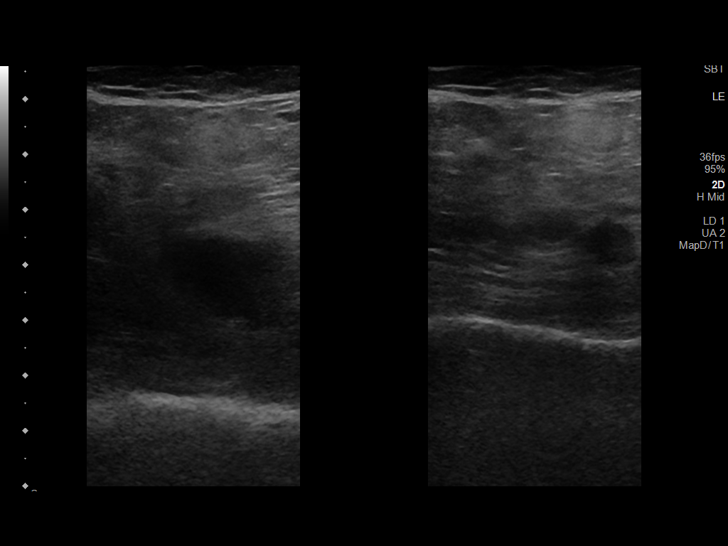

[14 of 24 positions shown; findings below may reference images not displayed]

FINDINGS: VENOUS

Normal compressibility of the common femoral, superficial femoral,
and popliteal veins, as well as the visualized calf veins.
Visualized portions of profunda femoral vein and great saphenous
vein unremarkable. No filling defects to suggest DVT on grayscale or
color Doppler imaging. Doppler waveforms show normal direction of
venous flow, normal respiratory plasticity and response to
augmentation.

Limited views of the contralateral common femoral vein are
unremarkable.

OTHER

None.

Limitations: none
IMPRESSION: Negative.

## 2021-12-30 ENCOUNTER — Other Ambulatory Visit: Payer: Self-pay

## 2021-12-30 ENCOUNTER — Emergency Department (HOSPITAL_BASED_OUTPATIENT_CLINIC_OR_DEPARTMENT_OTHER): Payer: Commercial Managed Care - HMO

## 2021-12-30 ENCOUNTER — Encounter (HOSPITAL_COMMUNITY): Payer: Self-pay

## 2021-12-30 ENCOUNTER — Inpatient Hospital Stay (HOSPITAL_BASED_OUTPATIENT_CLINIC_OR_DEPARTMENT_OTHER)
Admission: EM | Admit: 2021-12-30 | Discharge: 2022-01-03 | DRG: 603 | Disposition: A | Discharge status: Home / Self Care | Payer: Commercial Managed Care - HMO | Attending: Family Medicine | Admitting: Family Medicine

## 2021-12-30 ENCOUNTER — Encounter (HOSPITAL_BASED_OUTPATIENT_CLINIC_OR_DEPARTMENT_OTHER): Payer: Self-pay

## 2021-12-30 DIAGNOSIS — I872 Venous insufficiency (chronic) (peripheral): Secondary | ICD-10-CM | POA: Diagnosis present

## 2021-12-30 DIAGNOSIS — Z79899 Other long term (current) drug therapy: Secondary | ICD-10-CM | POA: Diagnosis not present

## 2021-12-30 DIAGNOSIS — E669 Obesity, unspecified: Secondary | ICD-10-CM | POA: Diagnosis present

## 2021-12-30 DIAGNOSIS — K5900 Constipation, unspecified: Secondary | ICD-10-CM | POA: Diagnosis not present

## 2021-12-30 DIAGNOSIS — I70232 Atherosclerosis of native arteries of right leg with ulceration of calf: Secondary | ICD-10-CM | POA: Diagnosis not present

## 2021-12-30 DIAGNOSIS — L97809 Non-pressure chronic ulcer of other part of unspecified lower leg with unspecified severity: Secondary | ICD-10-CM | POA: Diagnosis present

## 2021-12-30 DIAGNOSIS — J45909 Unspecified asthma, uncomplicated: Secondary | ICD-10-CM | POA: Diagnosis present

## 2021-12-30 DIAGNOSIS — Z833 Family history of diabetes mellitus: Secondary | ICD-10-CM

## 2021-12-30 DIAGNOSIS — F17210 Nicotine dependence, cigarettes, uncomplicated: Secondary | ICD-10-CM | POA: Diagnosis present

## 2021-12-30 DIAGNOSIS — L97329 Non-pressure chronic ulcer of left ankle with unspecified severity: Secondary | ICD-10-CM | POA: Diagnosis present

## 2021-12-30 DIAGNOSIS — Z720 Tobacco use: Secondary | ICD-10-CM | POA: Diagnosis not present

## 2021-12-30 DIAGNOSIS — Z6836 Body mass index (BMI) 36.0-36.9, adult: Secondary | ICD-10-CM | POA: Diagnosis not present

## 2021-12-30 DIAGNOSIS — Z832 Family history of diseases of the blood and blood-forming organs and certain disorders involving the immune mechanism: Secondary | ICD-10-CM | POA: Diagnosis not present

## 2021-12-30 DIAGNOSIS — R7303 Prediabetes: Secondary | ICD-10-CM | POA: Diagnosis present

## 2021-12-30 DIAGNOSIS — I70242 Atherosclerosis of native arteries of left leg with ulceration of calf: Secondary | ICD-10-CM | POA: Diagnosis not present

## 2021-12-30 DIAGNOSIS — S81802A Unspecified open wound, left lower leg, initial encounter: Secondary | ICD-10-CM | POA: Diagnosis not present

## 2021-12-30 DIAGNOSIS — L0390 Cellulitis, unspecified: Secondary | ICD-10-CM | POA: Diagnosis not present

## 2021-12-30 DIAGNOSIS — L89899 Pressure ulcer of other site, unspecified stage: Secondary | ICD-10-CM | POA: Diagnosis not present

## 2021-12-30 DIAGNOSIS — L03116 Cellulitis of left lower limb: Principal | ICD-10-CM | POA: Diagnosis present

## 2021-12-30 DIAGNOSIS — R609 Edema, unspecified: Secondary | ICD-10-CM | POA: Diagnosis not present

## 2021-12-30 DIAGNOSIS — K921 Melena: Secondary | ICD-10-CM | POA: Diagnosis not present

## 2021-12-30 DIAGNOSIS — I83008 Varicose veins of unspecified lower extremity with ulcer other part of lower leg: Secondary | ICD-10-CM | POA: Diagnosis present

## 2021-12-30 DIAGNOSIS — L089 Local infection of the skin and subcutaneous tissue, unspecified: Principal | ICD-10-CM

## 2021-12-30 DIAGNOSIS — E66812 Obesity, class 2: Secondary | ICD-10-CM | POA: Diagnosis present

## 2021-12-30 DIAGNOSIS — D500 Iron deficiency anemia secondary to blood loss (chronic): Secondary | ICD-10-CM | POA: Diagnosis present

## 2021-12-30 HISTORY — DX: Obesity, class 2: E66.812

## 2021-12-30 LAB — CBC WITH DIFFERENTIAL/PLATELET
Abs Immature Granulocytes: 0.02 10*3/uL (ref 0.00–0.07)
Basophils Absolute: 0 10*3/uL (ref 0.0–0.1)
Basophils Relative: 1 %
Eosinophils Absolute: 0.4 10*3/uL (ref 0.0–0.5)
Eosinophils Relative: 7 %
HCT: 34.6 % — ABNORMAL LOW (ref 39.0–52.0)
Hemoglobin: 11.2 g/dL — ABNORMAL LOW (ref 13.0–17.0)
Immature Granulocytes: 0 %
Lymphocytes Relative: 33 %
Lymphs Abs: 2 10*3/uL (ref 0.7–4.0)
MCH: 26.3 pg (ref 26.0–34.0)
MCHC: 32.4 g/dL (ref 30.0–36.0)
MCV: 81.2 fL (ref 80.0–100.0)
Monocytes Absolute: 0.6 10*3/uL (ref 0.1–1.0)
Monocytes Relative: 9 %
Neutro Abs: 3 10*3/uL (ref 1.7–7.7)
Neutrophils Relative %: 50 %
Platelets: 223 10*3/uL (ref 150–400)
RBC: 4.26 MIL/uL (ref 4.22–5.81)
RDW: 14.3 % (ref 11.5–15.5)
WBC: 6 10*3/uL (ref 4.0–10.5)
nRBC: 0 % (ref 0.0–0.2)

## 2021-12-30 LAB — HEMOGLOBIN AND HEMATOCRIT, BLOOD
HCT: 35.6 % — ABNORMAL LOW (ref 39.0–52.0)
Hemoglobin: 11 g/dL — ABNORMAL LOW (ref 13.0–17.0)

## 2021-12-30 LAB — BASIC METABOLIC PANEL
Anion gap: 6 (ref 5–15)
BUN: 18 mg/dL (ref 6–20)
CO2: 28 mmol/L (ref 22–32)
Calcium: 8.9 mg/dL (ref 8.9–10.3)
Chloride: 104 mmol/L (ref 98–111)
Creatinine, Ser: 0.72 mg/dL (ref 0.61–1.24)
GFR, Estimated: >60 mL/min (ref >60)
Glucose, Bld: 125 mg/dL — ABNORMAL HIGH (ref 70–99)
Potassium: 4.2 mmol/L (ref 3.5–5.1)
Sodium: 138 mmol/L (ref 135–145)

## 2021-12-30 LAB — GLUCOSE, CAPILLARY: Glucose-Capillary: 97 mg/dL (ref 70–99)

## 2021-12-30 MED ORDER — ONDANSETRON HCL 4 MG PO TABS: 4.0000 mg | ORAL_TABLET | Freq: Four times a day (QID) | ORAL | Status: DC | PRN

## 2021-12-30 MED ORDER — ONDANSETRON HCL 4 MG/2ML IJ SOLN: 4.0000 mg | Freq: Four times a day (QID) | INTRAMUSCULAR | Status: DC | PRN

## 2021-12-30 MED ORDER — ACETAMINOPHEN 650 MG RE SUPP: 650.0000 mg | Freq: Four times a day (QID) | RECTAL | Status: DC | PRN

## 2021-12-30 MED ORDER — SODIUM CHLORIDE 0.9 % IV SOLN: 2.0000 g | Freq: Three times a day (TID) | INTRAVENOUS | Status: DC

## 2021-12-30 MED ORDER — PIPERACILLIN-TAZOBACTAM 3.375 G IVPB 30 MIN
3.3750 g | INTRAVENOUS | Status: AC
  Administered 2021-12-30: 3.375 g via INTRAVENOUS
  Filled 2021-12-30: qty 50

## 2021-12-30 MED ORDER — SODIUM CHLORIDE 0.9 % IV SOLN
2.0000 g | Freq: Three times a day (TID) | INTRAVENOUS | Status: DC
  Administered 2021-12-30 – 2022-01-01 (×6): 2 g via INTRAVENOUS
  Filled 2021-12-30 (×5): qty 12.5

## 2021-12-30 MED ORDER — VANCOMYCIN HCL 1500 MG/300ML IV SOLN
1500.0000 mg | Freq: Once | INTRAVENOUS | Status: AC
  Administered 2021-12-30: 1500 mg via INTRAVENOUS
  Filled 2021-12-30: qty 300

## 2021-12-30 MED ORDER — INSULIN ASPART 100 UNIT/ML IJ SOLN: 0.0000 [IU] | Freq: Three times a day (TID) | INTRAMUSCULAR | Status: DC

## 2021-12-30 MED ORDER — VANCOMYCIN HCL IN DEXTROSE 1-5 GM/200ML-% IV SOLN
1000.0000 mg | Freq: Once | INTRAVENOUS | Status: AC
  Administered 2021-12-30: 1000 mg via INTRAVENOUS
  Filled 2021-12-30: qty 200

## 2021-12-30 MED ORDER — ENOXAPARIN SODIUM 60 MG/0.6ML IJ SOSY: 60.0000 mg | PREFILLED_SYRINGE | INTRAMUSCULAR | Status: DC

## 2021-12-30 MED ORDER — OXYCODONE HCL 5 MG PO TABS
5.0000 mg | ORAL_TABLET | ORAL | Status: DC | PRN
  Administered 2021-12-31 – 2022-01-03 (×9): 5 mg via ORAL
  Filled 2021-12-30 (×9): qty 1

## 2021-12-30 MED ORDER — VANCOMYCIN HCL 1500 MG/300ML IV SOLN
1500.0000 mg | Freq: Two times a day (BID) | INTRAVENOUS | Status: DC
  Administered 2021-12-31 – 2022-01-01 (×3): 1500 mg via INTRAVENOUS
  Filled 2021-12-30 (×4): qty 300

## 2021-12-30 MED ORDER — KETOROLAC TROMETHAMINE 30 MG/ML IJ SOLN
30.0000 mg | Freq: Once | INTRAMUSCULAR | Status: AC
  Administered 2021-12-30: 30 mg via INTRAVENOUS
  Filled 2021-12-30: qty 1

## 2021-12-30 MED ORDER — KETOROLAC TROMETHAMINE 30 MG/ML IJ SOLN
15.0000 mg | Freq: Once | INTRAMUSCULAR | Status: AC
  Administered 2021-12-30: 15 mg via INTRAVENOUS
  Filled 2021-12-30: qty 1

## 2021-12-30 MED ORDER — ACETAMINOPHEN 325 MG PO TABS
650.0000 mg | ORAL_TABLET | Freq: Four times a day (QID) | ORAL | Status: DC | PRN
  Administered 2022-01-01 – 2022-01-03 (×2): 650 mg via ORAL
  Filled 2021-12-30 (×2): qty 2

## 2021-12-30 NOTE — ED Notes (Signed)
Pt report given to [facility] 

## 2021-12-30 NOTE — ED Notes (Signed)
Pt resting in bed. Pt is very appreciative of everything staff is doing for him.

## 2021-12-30 NOTE — ED Notes (Signed)
Report given to Patterson Hammersmith, RN

## 2021-12-30 NOTE — ED Notes (Signed)
Attempted to review medications with pt. Pt states that he hasn't taken medications in over a year and doesn't take inhaler anymore because his allergies got better.

## 2021-12-30 NOTE — ED Notes (Signed)
[facility] at bedside for transport. 

## 2021-12-30 NOTE — ED Notes (Signed)
Hospitalist consult called to [facility] (Tammy).

## 2021-12-30 NOTE — ED Notes (Signed)
Pt provided with food per request.

## 2021-12-30 NOTE — H&P (Signed)
History and Physical    Patient: Raymond Gonzalez [id number] DOB: 1970/04/30 DOA: 12/30/2021 DOS: the patient was seen and examined on 12/30/2021 PCP: Gildardo Pounds, NP  Patient coming from: Home  Chief Complaint:  Chief Complaint  Patient presents with   Leg Pain   HPI: Raymond Gonzalez is a 51 y.o. male with medical history significant of asthma, prediabetes, class II obesity, tobacco use who is coming to the emergency department due to worsening of chronic LLE venous stasis ulcers which now have more erythema, surrounding edema, foul-smelling discharge and are tender to palpation.  No trauma history to the area. He denied fever, chills, rhinorrhea, sore throat, wheezing or hemoptysis.  No chest pain, palpitations, diaphoresis, PND, orthopnea or pitting edema of the lower extremities.  No abdominal pain, nausea, emesis, diarrhea, constipation or hematochezia.  However, he stated that he has been having melena.  No flank pain, dysuria, frequency or hematuria.  No polyuria, polydipsia, polyphagia or blurred vision.   ED course: Initial vital signs were temperature 98 F, pulse 89, respirations 16, BP 153/84 mmHg O2 sat 95% on room air.  The patient received vancomycin, Zosyn and 15 mg of Toradol in the emergency department.  I ordered Toradol 30 mg IVP x1 after the patient arrived to the hospital.  Lab work: CBC showed a white count of 6.0, hemoglobin 11.2 g/dL platelets 223.  BMP showed a glucose of 125 mg/deciliter, but was otherwise normal.  Imaging: Left tibia and fibula x-rays show soft tissue swelling, but no foreign body or osteomyelitis.  Review of Systems: As mentioned in the history of present illness. All other systems reviewed and are negative. Past Medical History:  Diagnosis Date   Asthma    Prediabetes    Past Surgical History:  Procedure Laterality Date   NO PAST SURGERIES     Social History:  reports that he has been smoking cigarettes. He has been smoking an average  of .25 packs per day. He has never used smokeless tobacco. He reports current drug use. Drug: Marijuana. He reports that he does not drink alcohol.  No Known Allergies  Family History  Problem Relation Age of Onset   Depression Neg Hx     Prior to Admission medications   Medication Sig Start Date End Date Taking? Authorizing Provider  albuterol (VENTOLIN HFA) 108 (90 Base) MCG/ACT inhaler Inhale 2 puffs into the lungs every 4 (four) hours as needed for wheezing or shortness of breath. PASS PROGRAM 02/08/20   Gildardo Pounds, NP  amLODipine (NORVASC) 5 MG tablet Take 1 tablet (5 mg total) by mouth daily. 02/11/20   Gildardo Pounds, NP  triamcinolone cream (KENALOG) 0.1 % Apply 1 application topically 2 (two) times daily. 11/05/20   Maudie Flakes, MD  white petrolatum (VASELINE) OINT Apply 1 application topically as needed for dry skin. 10/01/19   Renita Papa, PA-C    Physical Exam: Vitals:   12/30/21 1200 12/30/21 1200 12/30/21 1412 12/30/21 1509  BP: (!) 142/89  139/75 (!) 156/94  Pulse: 75  92 87  Resp: 17  16 16   Temp:  97.9 F (36.6 C) 98 F (36.7 C) 97.8 F (36.6 C)  TempSrc:  Oral Oral Oral  SpO2: 100%  97% 97%  Weight:      Height:       Physical Exam Vitals and nursing note reviewed.  Constitutional:      General: He is awake. He is not in acute distress.  Appearance: Normal appearance. He is obese. He is not toxic-appearing.  HENT:     Head: Normocephalic.     Nose: No rhinorrhea.     Mouth/Throat:     Mouth: Mucous membranes are moist.     Pharynx: No oropharyngeal exudate or posterior oropharyngeal erythema.  Eyes:     General: No scleral icterus.    Pupils: Pupils are equal, round, and reactive to light.  Neck:     Vascular: No JVD.  Cardiovascular:     Rate and Rhythm: Normal rate and regular rhythm.     Heart sounds: S1 normal and S2 normal.  Pulmonary:     Effort: Pulmonary effort is normal.     Breath sounds: Normal breath sounds. No wheezing,  rhonchi or rales.  Abdominal:     General: Bowel sounds are normal. There is no distension.     Palpations: Abdomen is soft.     Tenderness: There is no abdominal tenderness. There is no right CVA tenderness, left CVA tenderness or guarding.  Musculoskeletal:     Cervical back: Neck supple.     Right lower leg: Edema present.     Left lower leg: Edema present.  Skin:    General: Skin is warm and dry.     Findings: Erythema and wound present.     Comments: LLE showing multiple pretibial area and calf wounds with purulent discharge.  Please see pictures below.  Neurological:     General: No focal deficit present.     Mental Status: He is alert and oriented to person, place, and time.  Psychiatric:        Mood and Affect: Mood normal.        Behavior: Behavior normal. Behavior is cooperative.            Data Reviewed:  Results are pending, will review when available.  Assessment and Plan: Principal Problem:   Cellulitis of left lower extremity Admit to telemetry/inpatient. Begin cefepime 2 g every 8 hours.   Continue vancomycin per pharmacy. Follow-up blood culture and sensitivity Follow CBC and CMP in a.m. Consult wound/ostomy. Smoking cessation strongly advised. Check lower extremities venous Doppler. Check LE arterial US ABI with/without TBI. Mechanical prophylaxis deferred due to wounds. Patient to ambulate multiple times a day as tolerated.  Active Problems:   Chronic blood loss anemia Secondary to:   Melena Has been going on and off for a year. Monitor hematocrit and hemoglobin. Transfuse as needed. Chemical prophylaxis deferred. Begin pantoprazole 40 mg p.o. twice daily We will consult GI in the morning.    Asthma Bronchodilators as needed. Supplemental oxygen as needed.    Class 2 obesity Current BMI 36.94 kg/m. Weight loss advised.    Prediabetes Check hemoglobin A1c.    Tobacco use Only smoking a few cigarettes per day. Smoking cessation  advised. Nicotine replacement therapy as needed.     Advance Care Planning:   Code Status: Full code.  Consults: We will consult GI in the morning.  Family Communication:   Severity of Illness: The appropriate patient status for this patient is INPATIENT. Inpatient status is judged to be reasonable and necessary in order to provide the required intensity of service to ensure the patient's safety. The patient's presenting symptoms, physical exam findings, and initial radiographic and laboratory data in the context of their chronic comorbidities is felt to place them at high risk for further clinical deterioration. Furthermore, it is not anticipated that the patient will be medically stable for  discharge from the hospital within 2 midnights of admission.   * I certify that at the point of admission it is my clinical judgment that the patient will require inpatient hospital care spanning beyond 2 midnights from the point of admission due to high intensity of service, high risk for further deterioration and high frequency of surveillance required.*  Author: Reubin Milan, MD 12/30/2021 3:23 PM  For on call review [email or web address].   This document was prepared using Dragon voice recognition software and may contain some unintended transcription errors.

## 2021-12-30 NOTE — ED Provider Notes (Signed)
[facility] EMERGENCY DEPARTMENT Provider Note   CSN: [id number] Arrival date & time: 12/30/21  [id number]     History  Chief Complaint  Patient presents with   Leg Pain    Raymond Gonzalez is a 51 y.o. male.  The history is provided by the patient.  Leg Pain Location:  Leg Time since incident:  36 months Injury: no   Leg location:  L lower leg Pain details:    Radiates to:  Does not radiate   Severity:  Severe   Onset quality:  Gradual   Timing:  Constant   Progression:  Worsening Tetanus status:  Up to date Relieved by:  Nothing Worsened by:  Nothing Ineffective treatments:  None tried Associated symptoms: no fever   Risk factors: no concern for non-accidental trauma   Patient with venous stasis changes to BLE who was seeing the wound care center but stopped.  Wounds on legs are worsening and have a foul odor.  No fevers.  Patient does not have a family doctor at this time.       Home Medications Prior to Admission medications   Medication Sig Start Date End Date Taking? Authorizing Provider  albuterol (VENTOLIN HFA) 108 (90 Base) MCG/ACT inhaler Inhale 2 puffs into the lungs every 4 (four) hours as needed for wheezing or shortness of breath. PASS PROGRAM 02/08/20   Gildardo Pounds, NP  amLODipine (NORVASC) 5 MG tablet Take 1 tablet (5 mg total) by mouth daily. 02/11/20   Gildardo Pounds, NP  triamcinolone cream (KENALOG) 0.1 % Apply 1 application topically 2 (two) times daily. 11/05/20   Maudie Flakes, MD  white petrolatum (VASELINE) OINT Apply 1 application topically as needed for dry skin. 10/01/19   Rodell Perna A, PA-C      Allergies    Patient has no known allergies.    Review of Systems   Review of Systems  Constitutional:  Negative for fever.  Eyes:  Negative for redness.  Respiratory:  Negative for wheezing and stridor.   Gastrointestinal:  Negative for vomiting.  Musculoskeletal:  Positive for arthralgias.  Skin:  Positive for wound.  All other  systems reviewed and are negative.   Physical Exam Updated Vital Signs BP (!) 153/84 (BP Location: Right Arm)   Pulse 89   Temp 98 F (36.7 C) (Oral)   Resp 16   Ht 6\' 1"  (1.854 m)   Wt 127 kg   SpO2 95%   BMI 36.94 kg/m  Physical Exam Vitals and nursing note reviewed.  Constitutional:      General: He is not in acute distress.    Appearance: Normal appearance. He is well-developed. He is not diaphoretic.  HENT:     Head: Normocephalic and atraumatic.     Nose: Nose normal.  Eyes:     Conjunctiva/sclera: Conjunctivae normal.     Pupils: Pupils are equal, round, and reactive to light.  Cardiovascular:     Rate and Rhythm: Normal rate and regular rhythm.     Pulses: Normal pulses.     Heart sounds: Normal heart sounds.  Pulmonary:     Effort: Pulmonary effort is normal.     Breath sounds: Normal breath sounds. No wheezing or rales.  Abdominal:     General: Bowel sounds are normal.     Palpations: Abdomen is soft.     Tenderness: There is no abdominal tenderness. There is no guarding or rebound.  Musculoskeletal:  General: Normal range of motion.     Cervical back: Normal range of motion and neck supple.  Skin:    General: Skin is warm and dry.     Capillary Refill: Capillary refill takes less than 2 seconds.     Findings: Erythema present.       Neurological:     General: No focal deficit present.     Mental Status: He is alert and oriented to person, place, and time.     Deep Tendon Reflexes: Reflexes normal.  Psychiatric:        Mood and Affect: Mood normal.        Behavior: Behavior normal.     ED Results / Procedures / Treatments   Labs (all labs ordered are listed, but only abnormal results are displayed) Results for orders placed or performed during the hospital encounter of 12/30/21  CBC with Differential  Result Value Ref Range   WBC 6.0 4.0 - 10.5 K/uL   RBC 4.26 4.22 - 5.81 MIL/uL   Hemoglobin 11.2 (L) 13.0 - 17.0 g/dL   HCT 34.6 (L) 39.0  - 52.0 %   MCV 81.2 80.0 - 100.0 fL   MCH 26.3 26.0 - 34.0 pg   MCHC 32.4 30.0 - 36.0 g/dL   RDW 14.3 11.5 - 15.5 %   Platelets 223 150 - 400 K/uL   nRBC 0.0 0.0 - 0.2 %   Neutrophils Relative % 50 %   Neutro Abs 3.0 1.7 - 7.7 K/uL   Lymphocytes Relative 33 %   Lymphs Abs 2.0 0.7 - 4.0 K/uL   Monocytes Relative 9 %   Monocytes Absolute 0.6 0.1 - 1.0 K/uL   Eosinophils Relative 7 %   Eosinophils Absolute 0.4 0.0 - 0.5 K/uL   Basophils Relative 1 %   Basophils Absolute 0.0 0.0 - 0.1 K/uL   Immature Granulocytes 0 %   Abs Immature Granulocytes 0.02 0.00 - 0.07 K/uL  Basic metabolic panel  Result Value Ref Range   Sodium 138 135 - 145 mmol/L   Potassium 4.2 3.5 - 5.1 mmol/L   Chloride 104 98 - 111 mmol/L   CO2 28 22 - 32 mmol/L   Glucose, Bld 125 (H) 70 - 99 mg/dL   BUN 18 6 - 20 mg/dL   Creatinine, Ser 0.72 0.61 - 1.24 mg/dL   Calcium 8.9 8.9 - 10.3 mg/dL   GFR, Estimated >60 >60 mL/min   Anion gap 6 5 - 15   DG Tibia/Fibula Left  Result Date: 12/30/2021 CLINICAL DATA:  Leg pain.  Redness and swelling. EXAM: LEFT TIBIA AND FIBULA - 2 VIEW COMPARISON:  None Available. FINDINGS: Soft tissue changes in the anterior leg suggest soft tissue wound/ulceration. No underlying bony destruction or periosteal reaction to suggest osteomyelitis. No worrisome lytic or sclerotic osseous abnormality. IMPRESSION: Soft tissue wound/ulceration in the anterior lower leg. No underlying bony abnormality. Electronically Signed   By: Misty Stanley M.D.   On: 12/30/2021 06:15    Procedures Procedures    Medications Ordered in ED Medications  vancomycin (VANCOCIN) IVPB 1000 mg/200 mL premix (has no administration in time range)  piperacillin-tazobactam (ZOSYN) IVPB 3.375 g (3.375 g Intravenous New Bag/Given 12/30/21 2694)    ED Course/ Medical Decision Making/ A&P                           Medical Decision Making Patient with venous stasis dermatitis presents with leg pain and wounds  that he  reports have a foul odor to them  Amount and/or Complexity of Data Reviewed External Data Reviewed: notes.    Details: Previous notes reviewed  Labs: ordered.    Details: All labs reviewed: blood cultures ordered. Normal white count 6, low hemoglobin 11.2 normal platelet count 223k.  Normal sodium 138, normal potassium 4.2, normal creatinine .72.  Elevated glucose 125.   Radiology: ordered and independent interpretation performed.    Details: No osteomyelitis or gas in soft tissues by me   Risk Prescription drug management. Decision regarding hospitalization. Risk Details: Given severity of wound infection will require inpatient care.  Blood cultures sent and IV antibiotics initiated in the ED   The patient appears reasonably stabilized for admission considering the current resources, flow, and capabilities available in the ED at this time, and I doubt any other [facility] requiring further screening and/or treatment in the ED prior to admission.  Final Clinical Impression(s) / ED Diagnoses Final diagnoses:  None    Rx / DC Orders ED Discharge Orders     None         Kiasha Bellin, MD 12/30/21 [phone]

## 2021-12-30 NOTE — ED Notes (Signed)
Patient here d/t worsening wounds on left leg. States wounds have been there for 3 years. Hx of venous insufficiency.

## 2021-12-30 NOTE — Progress Notes (Signed)
Pharmacy Antibiotic Note  Christophe Rising is a 51 y.o. male admitted on 12/30/2021 with cellulitis.  Pharmacy has been consulted for vancomycin dosing.  Plan: Vancomycin 1000 mg IV x1 given in ED. Ordered additional 1500 mg IV dose, followed by vancomycin 1500 mg IV q12h ( AUC 447, SCr rounded to 0.8, Wt 127kg)  Cefepime 2 gr IV q8h per MD  Monitor clinical course, renal function, cultures as available   Height: 6\' 1"  (185.4 cm) Weight: 127 kg (280 lb) IBW/kg (Calculated) : 79.9  Temp (24hrs), Avg:97.9 F (36.6 C), Min:97.7 F (36.5 C), Max:98 F (36.7 C)  Recent Labs  Lab 12/30/21 0609  WBC 6.0  CREATININE 0.72    Estimated Creatinine Clearance: 150.8 mL/min (by C-G formula based on SCr of 0.72 mg/dL).    No Known Allergies  Antimicrobials this admission: 10/9 vancomycin >>  10/9 cefepime >>   Dose adjustments this admission:   Microbiology results: 10/9 BCx:     Thank you for allowing pharmacy to be a part of this patient's care.  Royetta Asal, PharmD, BCPS 12/30/2021 7:12 PM

## 2021-12-30 NOTE — Plan of Care (Signed)

## 2021-12-31 ENCOUNTER — Inpatient Hospital Stay (HOSPITAL_COMMUNITY): Payer: Commercial Managed Care - HMO

## 2021-12-31 DIAGNOSIS — Z720 Tobacco use: Secondary | ICD-10-CM

## 2021-12-31 DIAGNOSIS — D500 Iron deficiency anemia secondary to blood loss (chronic): Secondary | ICD-10-CM

## 2021-12-31 DIAGNOSIS — K921 Melena: Secondary | ICD-10-CM | POA: Diagnosis not present

## 2021-12-31 DIAGNOSIS — E669 Obesity, unspecified: Secondary | ICD-10-CM

## 2021-12-31 DIAGNOSIS — I70232 Atherosclerosis of native arteries of right leg with ulceration of calf: Secondary | ICD-10-CM

## 2021-12-31 DIAGNOSIS — L0390 Cellulitis, unspecified: Secondary | ICD-10-CM

## 2021-12-31 DIAGNOSIS — I70242 Atherosclerosis of native arteries of left leg with ulceration of calf: Secondary | ICD-10-CM

## 2021-12-31 DIAGNOSIS — R7303 Prediabetes: Secondary | ICD-10-CM

## 2021-12-31 DIAGNOSIS — R609 Edema, unspecified: Secondary | ICD-10-CM

## 2021-12-31 DIAGNOSIS — L89899 Pressure ulcer of other site, unspecified stage: Secondary | ICD-10-CM

## 2021-12-31 DIAGNOSIS — L03116 Cellulitis of left lower limb: Secondary | ICD-10-CM | POA: Diagnosis not present

## 2021-12-31 LAB — CBC
HCT: 34.5 % — ABNORMAL LOW (ref 39.0–52.0)
Hemoglobin: 10.7 g/dL — ABNORMAL LOW (ref 13.0–17.0)
MCH: 25.8 pg — ABNORMAL LOW (ref 26.0–34.0)
MCHC: 31 g/dL (ref 30.0–36.0)
MCV: 83.1 fL (ref 80.0–100.0)
Platelets: 180 10*3/uL (ref 150–400)
RBC: 4.15 MIL/uL — ABNORMAL LOW (ref 4.22–5.81)
RDW: 14.3 % (ref 11.5–15.5)
WBC: 5.9 10*3/uL (ref 4.0–10.5)
nRBC: 0 % (ref 0.0–0.2)

## 2021-12-31 LAB — COMPREHENSIVE METABOLIC PANEL
ALT: 12 U/L (ref 0–44)
AST: 12 U/L — ABNORMAL LOW (ref 15–41)
Albumin: 3 g/dL — ABNORMAL LOW (ref 3.5–5.0)
Alkaline Phosphatase: 52 U/L (ref 38–126)
Anion gap: 5 (ref 5–15)
BUN: 19 mg/dL (ref 6–20)
CO2: 30 mmol/L (ref 22–32)
Calcium: 8.8 mg/dL — ABNORMAL LOW (ref 8.9–10.3)
Chloride: 104 mmol/L (ref 98–111)
Creatinine, Ser: 0.63 mg/dL (ref 0.61–1.24)
GFR, Estimated: >60 mL/min (ref >60)
Glucose, Bld: 99 mg/dL (ref 70–99)
Potassium: 4.4 mmol/L (ref 3.5–5.1)
Sodium: 139 mmol/L (ref 135–145)
Total Bilirubin: 0.3 mg/dL (ref 0.3–1.2)
Total Protein: 6.7 g/dL (ref 6.5–8.1)

## 2021-12-31 LAB — GLUCOSE, CAPILLARY
Glucose-Capillary: 97 mg/dL (ref 70–99)
Glucose-Capillary: 89 mg/dL (ref 70–99)
Glucose-Capillary: 108 mg/dL — ABNORMAL HIGH (ref 70–99)
Glucose-Capillary: 107 mg/dL — ABNORMAL HIGH (ref 70–99)

## 2021-12-31 LAB — HEMOGLOBIN A1C
Hgb A1c MFr Bld: 5.8 % — ABNORMAL HIGH (ref 4.8–5.6)
Mean Plasma Glucose: 119.76 mg/dL

## 2021-12-31 LAB — HIV ANTIBODY (ROUTINE TESTING W REFLEX): HIV Screen 4th Generation wRfx: NONREACTIVE

## 2021-12-31 MED ORDER — SILVER SULFADIAZINE 1 % EX CREA
TOPICAL_CREAM | Freq: Every day | CUTANEOUS | Status: DC
  Administered 2021-12-31: 1 via TOPICAL
  Filled 2021-12-31: qty 50

## 2021-12-31 MED ORDER — SENNOSIDES-DOCUSATE SODIUM 8.6-50 MG PO TABS
1.0000 | ORAL_TABLET | Freq: Two times a day (BID) | ORAL | Status: DC
  Administered 2021-12-31 – 2022-01-03 (×6): 1 via ORAL
  Filled 2021-12-31 (×6): qty 1

## 2021-12-31 MED ORDER — BISACODYL 10 MG RE SUPP: 10.0000 mg | Freq: Every day | RECTAL | Status: DC | PRN

## 2021-12-31 MED ORDER — POLYETHYLENE GLYCOL 3350 17 G PO PACK
17.0000 g | PACK | Freq: Two times a day (BID) | ORAL | Status: DC
  Administered 2021-12-31 – 2022-01-03 (×6): 17 g via ORAL
  Filled 2021-12-31 (×6): qty 1

## 2021-12-31 NOTE — TOC Initial Note (Signed)
Transition of Care [facility]) - Initial/Assessment Note    Patient Details  Name: Raymond Gonzalez MRN: [id number] Date of Birth: 02/25/1971  Transition of [facility]) CM/SW Contact:    Vassie Moselle, LCSW Phone Number: 12/31/2021, 12:49 PM  Clinical Narrative:                 Pt screened for transportation insecurity. CSW met with pt who shares that his car is currently broken down and he is in the process of putting it back together. He states that he has friends and family who have been able to assist him with transportation. Pt agreeable to having additional transportation resources added to his discharge paperwork. Resources have been added to AVS.   Expected Discharge Plan: Home/Self Care Barriers to Discharge: Continued Medical Work up   Patient Goals and CMS Choice Patient states their goals for this hospitalization and ongoing recovery are:: To return home CMS Medicare.gov Compare Post Acute Care list provided to:: Patient Choice offered to / list presented to : Patient  Expected Discharge Plan and Services Expected Discharge Plan: Home/Self Care In-house Referral: NA Discharge Planning Services: NA Post Acute Care Choice: NA Living arrangements for the past 2 months: Single Family Home                 DME Arranged: N/A DME Agency: NA                  Prior Living Arrangements/Services Living arrangements for the past 2 months: Single Family Home Lives with:: Self Patient language and need for interpreter reviewed:: Yes Do you feel safe going back to the place where you live?: Yes      Need for Family Participation in Patient Care: No (Comment) Care giver support system in place?: No (comment)   Criminal Activity/Legal Involvement Pertinent to Current Situation/Hospitalization: No - Comment as needed  Activities of Daily Living Home Assistive Devices/Equipment: None ADL Screening (condition at time of admission) Patient's cognitive ability adequate to  safely complete daily activities?: Yes Is the patient deaf or have difficulty hearing?: No Does the patient have difficulty seeing, even when wearing glasses/contacts?: No Does the patient have difficulty concentrating, remembering, or making decisions?: No Patient able to express need for assistance with ADLs?: No Does the patient have difficulty dressing or bathing?: No Independently performs ADLs?: Yes (appropriate for developmental age) Does the patient have difficulty walking or climbing stairs?: No Weakness of Legs: None Weakness of Arms/Hands: None  Permission Sought/Granted   Permission granted to share information with : No              Emotional Assessment Appearance:: Appears younger than stated age Attitude/Demeanor/Rapport: Engaged Affect (typically observed): Accepting Orientation: : Oriented to Self, Oriented to Place, Oriented to  Time, Oriented to Situation Alcohol / Substance Use: Not Applicable Psych Involvement: No (comment)  Admission diagnosis:  Wound infection [T14.8XXA, L08.9] Cellulitis of left lower extremity [L03.116] Patient Active Problem List   Diagnosis Date Noted   Cellulitis of left lower extremity 12/30/2021   Asthma 12/30/2021   Class 2 obesity 12/30/2021   Tobacco use 12/30/2021   Chronic blood loss anemia 12/30/2021   Melena 12/30/2021   Prediabetes 12/30/2021   PCP:  Gildardo Pounds, NP Pharmacy:   [facility] [phone] - [place], [address] [place] [address] Phone: [phone] Fax: [phone]     Social Determinants  of Health (SDOH) Interventions Transportation Interventions: Other (Comment), Patient Resources (Friends/Family) (Provided community resources)  Readmission Risk Interventions    12/31/2021   12:47 PM  Readmission Risk Prevention Plan  Post Dischage Appt Complete  Medication Screening Complete  Transportation Screening Complete

## 2021-12-31 NOTE — Progress Notes (Signed)
PROGRESS NOTE    Raymond Gonzalez  ZOX:[id number] DOB: 1971-02-21 DOA: 12/30/2021 PCP: Claiborne Rigg, NP   Brief Narrative:  HPI per Sanda Klein on 12/30/21 Raymond Gonzalez is a 51 y.o. male with medical history significant of asthma, prediabetes, class II obesity, tobacco use who is coming to the emergency department due to worsening of chronic LLE venous stasis ulcers which now have more erythema, surrounding edema, foul-smelling discharge and are tender to palpation.  No trauma history to the area. He denied fever, chills, rhinorrhea, sore throat, wheezing or hemoptysis.  No chest pain, palpitations, diaphoresis, PND, orthopnea or pitting edema of the lower extremities.  No abdominal pain, nausea, emesis, diarrhea, constipation or hematochezia.  However, he stated that he has been having melena.  No flank pain, dysuria, frequency or hematuria.  No polyuria, polydipsia, polyphagia or blurred vision.    ED course: Initial vital signs were temperature 98 F, pulse 89, respirations 16, BP 153/84 mmHg O2 sat 95% on room air.  The patient received vancomycin, Zosyn and 15 mg of Toradol in the emergency department.  I ordered Toradol 30 mg IVP x1 after the patient arrived to the hospital.   Lab work: CBC showed a white count of 6.0, hemoglobin 11.2 g/dL platelets 811.  BMP showed a glucose of 125 mg/deciliter, but was otherwise normal.   Imaging: Left tibia and fibula x-rays show soft tissue swelling, but no foreign body or osteomyelitis.   **Interim History Patient continues to have some black stools that he says has been going on for a year and so we will check an FOBT.  Case was discussed with gastroenterology and will defer formal consultation at this point given FOBT pending and relatively stable hemoglobin.  Given his lower extremity ulcers we will check venous and vascular duplex and ABIs and vascular surgery has been formally consulted for further evaluation recommendations given that he has some  pain with rest in his feet  Assessment and Plan:  Cellulitis of left lower extremity with nonhealing left lower extremity ulcers -Admit to telemetry/inpatient. -Begin cefepime 2 g every 8 hours.   -Continue vancomycin per pharmacy. -Follow-up blood culture and sensitivity -Follow CBC and CMP in a.m. -Consult wound/ostomy. -He does have a history of venous stasis but wounds are worsened -Smoking cessation strongly advised. -Check lower extremities venous Doppler done and showed "RIGHT:  - There is no evidence of deep vein thrombosis in the lower extremity.     - No cystic structure found in the popliteal fossa.     LEFT:  - There is no evidence of deep vein thrombosis in the lower extremity.  However, portions of this examination were limited- see technologist  comments above.     - No cystic structure found in the popliteal fossa"  -Check LE arterial US ABI with/without TBI and done and showed "Right: Resting right ankle-brachial index is within normal range. The  right toe-brachial index is normal.   Left: The left toe-brachial index is normal.   Unable to obtain ABI due to ankle ulcers. " -Mechanical prophylaxis deferred due to wounds. -Patient to ambulate multiple times a day as tolerated. 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 -Monitor hematocrit and hemoglobin.  Hemoglobin/hematocrit is trending downward from 11.2/34.6 -> 11.0/35.6 -> 10.7/34.5 -Transfuse as needed. -Chemical prophylaxis deferred. -Begin pantoprazole 40 mg p.o. twice daily -Discussed with GI who recommends the patient does  need a colonoscopy given his age and will obtain FOBT first but colonoscopy can be deferred to outpatient setting -We will need to monitor for signs and symptoms of bleeding and if he has overt bleeding we will need to consult GI formally   Asthma -Continue Bronchodilators  as needed. -Supplemental oxygen as needed. -Currently not decompensated  PreDiabetes -Check hemoglobin A1c and was 5.8 -Continue monitor blood sugars per protocol and if necessary will place on sensitive NovoLog/scale insulin AC   Tobacco use -Only smoking a few cigarettes per day. -Smoking cessation counseling given. -Nicotine replacement therapy as needed.   Obesity -Complicates overall prognosis and care -Estimated body mass index is 36.94 kg/m as calculated from the following:   Height as of this encounter: 6\' 1"  (1.854 m).   Weight as of this encounter: 127 kg.  -Weight Loss and Dietary Counseling given  DVT prophylaxis: Deferred    Code Status: Full Code Family Communication: No family currently at bedside  Disposition Plan:  Level of care: Telemetry Status is: Inpatient Remains inpatient appropriate because: Needs further clinical work-up and improvement in his leg cellulitis and ulcers   Consultants:  Discussed with gastroenterology Vascular surgery Dr. Scot Dock to formally consult  Procedures:  As delineated as above  Antimicrobials:  Anti-infectives (From admission, onward)    Start     Dose/Rate Route Frequency Ordered Stop   12/31/21 0800  vancomycin (VANCOREADY) IVPB 1500 mg/300 mL        1,500 mg 150 mL/hr over 120 Minutes Intravenous Every 12 hours 12/30/21 1913     12/30/21 2200  ceFEPIme (MAXIPIME) 2 g in sodium chloride 0.9 % 100 mL IVPB  Status:  Discontinued        2 g 200 mL/hr over 30 Minutes Intravenous Every 8 hours 12/30/21 1715 12/30/21 1720   12/30/21 1815  vancomycin (VANCOREADY) IVPB 1500 mg/300 mL        1,500 mg 150 mL/hr over 120 Minutes Intravenous  Once 12/30/21 1719 12/30/21 2138   12/30/21 1800  ceFEPIme (MAXIPIME) 2 g in sodium chloride 0.9 % 100 mL IVPB        2 g 200 mL/hr over 30 Minutes Intravenous Every 8 hours 12/30/21 1719     12/30/21 0630  vancomycin (VANCOCIN) IVPB 1000 mg/200 mL premix        1,000 mg 200 mL/hr  over 60 Minutes Intravenous  Once 12/30/21 0625 12/30/21 0806   12/30/21 0630  piperacillin-tazobactam (ZOSYN) IVPB 3.375 g        3.375 g 100 mL/hr over 30 Minutes Intravenous STAT 12/30/21 0625 12/30/21 0710       Subjective: Seen and examined at bedside and he is complaining about some pain in his left leg at the ulcers.  Also states that he has had black stools for about a year now.  Denies any nausea or vomiting.  No other concerns or complaints at this time.  Objective: Vitals:   12/30/21 1509 12/30/21 2008 12/30/21 2223 12/31/21 0350  BP: (!) 156/94 131/78 (!) 104/56 (!) 150/83  Pulse: 87 65 75 77  Resp: 16 18 18 18   Temp: 97.8 F (36.6 C) 97.8 F (36.6 C) 98 F (36.7 C) 97.9 F (36.6 C)  TempSrc: Oral Oral Oral Oral  SpO2: 97% 98% 98% 97%  Weight:      Height:        Intake/Output Summary (Last 24 hours) at 12/31/2021 1707 Last data filed at 12/31/2021 0306 Gross per 24 hour  Intake  200 ml  Output --  Net 200 ml   Filed Weights   12/30/21 0524  Weight: 127 kg   Examination: Physical Exam:  Constitutional: WN/WD morbid obese chronically ill-appearing Caucasian male currently no acute distress Respiratory: Diminished to auscultation bilaterally, no wheezing, rales, rhonchi or crackles. Normal respiratory effort and patient is not tachypenic. No accessory muscle use.  Unlabored breathing Cardiovascular: RRR, no murmurs / rubs / gallops. S1 and S2 auscultated.  Has lower extremity swelling worse on the left compared to right with ulcerations Abdomen: Soft, non-tender, distended secondary body habitus.. Bowel sounds positive.  GU: Deferred. Musculoskeletal: No clubbing / cyanosis of digits/nails. No joint deformity upper and lower extremities.  Skin: Significant left lower extremity ulcers noted on his shin Neurologic: CN 2-12 grossly intact with no focal deficits.  Romberg sign and cerebellar reflexes not assessed.  Psychiatric: Normal judgment and insight. Alert  and oriented x 3. Normal mood and appropriate affect.   Data Reviewed: I have personally reviewed following labs and imaging studies  CBC: Recent Labs  Lab 12/30/21 0609 12/30/21 1949 12/31/21 0514  WBC 6.0  --  5.9  NEUTROABS 3.0  --   --   HGB 11.2* 11.0* 10.7*  HCT 34.6* 35.6* 34.5*  MCV 81.2  --  83.1  PLT 223  --  180   Basic Metabolic Panel: Recent Labs  Lab 12/30/21 0609 12/31/21 0514  NA 138 139  K 4.2 4.4  CL 104 104  CO2 28 30  GLUCOSE 125* 99  BUN 18 19  CREATININE 0.72 0.63  CALCIUM 8.9 8.8*   GFR: Estimated Creatinine Clearance: 150.8 mL/min (by C-G formula based on SCr of 0.63 mg/dL). Liver Function Tests: Recent Labs  Lab 12/31/21 0514  AST 12*  ALT 12  ALKPHOS 52  BILITOT 0.3  PROT 6.7  ALBUMIN 3.0*   No results for input(s): "LIPASE", "AMYLASE" in the last 168 hours. No results for input(s): "AMMONIA" in the last 168 hours. Coagulation Profile: No results for input(s): "INR", "PROTIME" in the last 168 hours. Cardiac Enzymes: No results for input(s): "CKTOTAL", "CKMB", "CKMBINDEX", "TROPONINI" in the last 168 hours. BNP (last 3 results) No results for input(s): "PROBNP" in the last 8760 hours. HbA1C: Recent Labs    12/31/21 0514  HGBA1C 5.8*   CBG: Recent Labs  Lab 12/30/21 2009 12/31/21 0730 12/31/21 1146 12/31/21 1600  GLUCAP 97 97 89 108*   Lipid Profile: No results for input(s): "CHOL", "HDL", "LDLCALC", "TRIG", "CHOLHDL", "LDLDIRECT" in the last 72 hours. Thyroid Function Tests: No results for input(s): "TSH", "T4TOTAL", "FREET4", "T3FREE", "THYROIDAB" in the last 72 hours. Anemia Panel: No results for input(s): "VITAMINB12", "FOLATE", "FERRITIN", "TIBC", "IRON", "RETICCTPCT" in the last 72 hours. Sepsis Labs: No results for input(s): "PROCALCITON", "LATICACIDVEN" in the last 168 hours.  Recent Results (from the past 240 hour(s))  Blood culture (routine x 2)     Status: None (Preliminary result)   Collection Time:  12/30/21  6:09 AM   Specimen: BLOOD RIGHT FOREARM  Result Value Ref Range Status   Specimen Description   Final    BLOOD RIGHT FOREARM Performed at [facility], [address] [place], [name] [address]    Special Requests   Final    BOTTLES DRAWN AEROBIC AND ANAEROBIC Blood Culture adequate volume Performed at [facility], [address] [place], [name] [address]    Culture   Final    NO GROWTH 1 DAY Performed at  [facility], [address] [place], [place] [address]    Report Status PENDING  Incomplete  Blood culture (routine x 2)     Status: None (Preliminary result)   Collection Time: 12/30/21  6:09 AM   Specimen: BLOOD RIGHT HAND  Result Value Ref Range Status   Specimen Description   Final    BLOOD RIGHT HAND Performed at [facility] [place], [address] [place], [place] [address]    Special Requests   Final    BOTTLES DRAWN AEROBIC AND ANAEROBIC Blood Culture results may not be optimal due to an inadequate volume of blood received in culture bottles Performed at [facility], [address] [place], [place] [address]    Culture   Final    NO GROWTH 1 DAY Performed at [facility], [address] [place], [place] [address]    Report Status PENDING  Incomplete    Radiology Studies: VAS US ABI WITH/WO TBI  Result Date: 12/31/2021  LOWER EXTREMITY DOPPLER STUDY Patient Name:  Raymond BilisMATTHEW Dehart  Date of Exam:   12/31/2021 Medical Rec #: [id number]      Accession #:    [id number] Date of Birth: November 24, 1970     Patient Gender: M Patient Age:   [age] years [facility] Procedure:      VAS US ABI WITH/WO TBI Referring Phys: DAVID ORTIZ --------------------------------------------------------------------------------  Indications: Ulceration. High Risk Factors: None.  Limitations: Today's exam was limited due to an open wound and bandages. Comparison Study: No prior studies. Performing Technologist:  Olen Cordialollins, Greg RVT  Examination Guidelines: A complete evaluation includes at minimum, Doppler waveform signals and systolic blood pressure reading at the level of bilateral brachial, anterior tibial, and posterior tibial arteries, when vessel segments are accessible. Bilateral testing is considered an integral part of a complete examination. Photoelectric Plethysmograph (PPG) waveforms and toe systolic pressure readings are included as required and additional duplex testing as needed. Limited examinations for reoccurring indications may be performed as noted.  ABI Findings: +---------+------------------+-----+---------+--------+ Right    Rt Pressure (mmHg)IndexWaveform Comment  +---------+------------------+-----+---------+--------+ Brachial 130                    triphasic         +---------+------------------+-----+---------+--------+ PTA      163               1.21 triphasic         +---------+------------------+-----+---------+--------+ DP       147               1.09 triphasic         +---------+------------------+-----+---------+--------+ Great Toe121               0.90                   +---------+------------------+-----+---------+--------+ +---------+------------------+-----+-----------+-------+ Left     Lt Pressure (mmHg)IndexWaveform   Comment +---------+------------------+-----+-----------+-------+ Brachial 135                    triphasic          +---------+------------------+-----+-----------+-------+ PTA                             multiphasic        +---------+------------------+-----+-----------+-------+ DP  multiphasic        +---------+------------------+-----+-----------+-------+ Great Toe94                0.70                    +---------+------------------+-----+-----------+-------+ +-------+-----------+-----------+------------+------------+ ABI/TBIToday's ABIToday's TBIPrevious ABIPrevious TBI  +-------+-----------+-----------+------------+------------+ Right  1.21       0.9                                 +-------+-----------+-----------+------------+------------+ Left              0.7                                 +-------+-----------+-----------+------------+------------+  Summary: Right: Resting right ankle-brachial index is within normal range. The right toe-brachial index is normal. Left: The left toe-brachial index is normal. Unable to obtain ABI due to ankle ulcers. *See table(s) above for measurements and observations.     Preliminary    VAS [place] LOWER EXTREMITY VENOUS (DVT)  Result Date: 12/31/2021  Lower Venous DVT Study Patient Name:  Raymond Gonzalez  Date of Exam:   12/31/2021 Medical Rec #: [id number]      Accession #:    [id number] Date of Birth: 02/24/1971     Patient Gender: M Patient Age:   45 years Exam Location:  [facility] Procedure:      VAS [place] LOWER EXTREMITY VENOUS (DVT) Referring Phys: DAVID ORTIZ --------------------------------------------------------------------------------  Indications: Edema.  Risk Factors: None identified. Limitations: Body habitus, poor ultrasound/tissue interface, open wound and bandages. Comparison Study: No prior studies. Performing Technologist: Chanda Busing RVT  Examination Guidelines: A complete evaluation includes B-mode imaging, spectral Doppler, color Doppler, and power Doppler as needed of all accessible portions of each vessel. Bilateral testing is considered an integral part of a complete examination. Limited examinations for reoccurring indications may be performed as noted. The reflux portion of the exam is performed with the patient in reverse Trendelenburg.  +---------+---------------+---------+-----------+----------+--------------+ RIGHT    CompressibilityPhasicitySpontaneityPropertiesThrombus Aging +---------+---------------+---------+-----------+----------+--------------+ CFV      Full           Yes       Yes                                 +---------+---------------+---------+-----------+----------+--------------+ SFJ      Full                                                        +---------+---------------+---------+-----------+----------+--------------+ FV Prox  Full                                                        +---------+---------------+---------+-----------+----------+--------------+ FV Mid   Full                                                        +---------+---------------+---------+-----------+----------+--------------+  FV DistalFull                                                        +---------+---------------+---------+-----------+----------+--------------+ PFV      Full                                                        +---------+---------------+---------+-----------+----------+--------------+ POP      Full           Yes      Yes                                 +---------+---------------+---------+-----------+----------+--------------+ PTV      Full                                                        +---------+---------------+---------+-----------+----------+--------------+ PERO     Full                                                        +---------+---------------+---------+-----------+----------+--------------+   +---------+---------------+---------+-----------+----------+-------------------+ LEFT     CompressibilityPhasicitySpontaneityPropertiesThrombus Aging      +---------+---------------+---------+-----------+----------+-------------------+ CFV      Full           Yes      Yes                                      +---------+---------------+---------+-----------+----------+-------------------+ SFJ      Full                                                             +---------+---------------+---------+-----------+----------+-------------------+ FV Prox  Full                                                              +---------+---------------+---------+-----------+----------+-------------------+ FV Mid                  Yes      Yes                                      +---------+---------------+---------+-----------+----------+-------------------+ FV Distal               Yes  Yes                                      +---------+---------------+---------+-----------+----------+-------------------+ PFV      Full                                                             +---------+---------------+---------+-----------+----------+-------------------+ POP      Full           Yes      Yes                                      +---------+---------------+---------+-----------+----------+-------------------+ PTV      Full                                                             +---------+---------------+---------+-----------+----------+-------------------+ PERO                                                  Not well visualized +---------+---------------+---------+-----------+----------+-------------------+    Summary: RIGHT: - There is no evidence of deep vein thrombosis in the lower extremity.  - No cystic structure found in the popliteal fossa.  LEFT: - There is no evidence of deep vein thrombosis in the lower extremity. However, portions of this examination were limited- see technologist comments above.  - No cystic structure found in the popliteal fossa.  *See table(s) above for measurements and observations.    Preliminary    DG Tibia/Fibula Left  Result Date: 12/30/2021 CLINICAL DATA:  Leg pain.  Redness and swelling. EXAM: LEFT TIBIA AND FIBULA - 2 VIEW COMPARISON:  None Available. FINDINGS: Soft tissue changes in the anterior leg suggest soft tissue wound/ulceration. No underlying bony destruction or periosteal reaction to suggest osteomyelitis. No worrisome lytic or sclerotic osseous abnormality. IMPRESSION: Soft tissue wound/ulceration in  the anterior lower leg. No underlying bony abnormality. Electronically Signed   By: Kennith Center M.D.   On: 12/30/2021 06:15     Scheduled Meds:  insulin aspart  0-15 Units Subcutaneous TID WC   polyethylene glycol  17 g Oral BID   senna-docusate  1 tablet Oral BID   silver sulfADIAZINE   Topical Daily   Continuous Infusions:  ceFEPime (MAXIPIME) IV 2 g (12/31/21 0855)   vancomycin 1,500 mg (12/31/21 0939)    LOS: 1 day   Marguerita Merles, DO [facility] Available via Epic secure chat 7am-7pm After these hours, please refer to coverage provider listed on [email or web address] 12/31/2021, 5:07 PM

## 2021-12-31 NOTE — Plan of Care (Signed)

## 2021-12-31 NOTE — Progress Notes (Signed)
Bilateral lower extremity venous duplex and ABI's have been completed. Preliminary results can be found in CV Proc through chart review.   12/31/21 1:42 PM Carlos Levering RVT

## 2021-12-31 NOTE — Consult Note (Addendum)
[facility] Nurse Consult Note: Reason for Consult: Numerous atypical wounds to LLE, full thickness.  Wound type: Atypical, consider vasculitis, infectious and autoimmune as differential diagnoses Pressure Injury POA:N/A Measurement: multiple full thickness wounds on left LE with the largest measuring 7cm x 4.5cm x 0.3cm. Wound bed: Ruddy red to dark brown Drainage (amount, consistency, odor) small serosanguinous  Periwound: with edema and erythema Dressing procedure/placement/frequency:I will provide guidance for Nursing for the topical care of these lesions using a daily soap and water cleanse, rinse, and gentle pat dry followed by application of silver sulfadiazine cream (Silvadene) topped with saline moistened gauze, dry gauze and covered with ABD pads for absorbency and comfort. The dressings are to be secured with Kerlix roll gauze applied from just below toes to just below knee and topped with ACE bandage wrapped in a similar manner.  Recommend Vascular consult for evaluation/determination of wound etiology as well as oversight and follow up care. If you agree, please order/arrange.  Consider an ABI to determine perfusion status.  Any orders provided by those or another provider will supercede mine.  [place] nursing team will not follow, but will remain available to this patient, the nursing and medical teams.  Please re-consult if needed.  Thank you for inviting [place] to participate in this patient's Plan of Care.  Maudie Flakes, MSN, RN, CNS, [place], Serita Grammes, [employer], [employer] phone:  [phone]

## 2021-12-31 NOTE — Consult Note (Addendum)
ASSESSMENT & PLAN   CHRONIC VENOUS INSUFFICIENCY: This patient has advanced venous disease with open ulcers, hyperpigmentation, and lipodermatosclerosis.  Fortunately, he does not have evidence of significant arterial insufficiency.  He has normal Doppler signals in the left foot, palpable dorsalis pedis and posterior tibial pulses on the left, and a toe pressure of 94 which suggest adequate circulation for healing.  These should be treated as venous ulcers with elevation and compression.  I made several recommendations to him.  He should stop smoking completely.  Nicotine can cause vasospasm in the microcirculation for up to 12 hours so he needs to completely get off tobacco. We have talked about the importance of leg elevation and the proper positioning for this.  I have written a specific order so the nursing staff knows how to properly elevate his legs. He should have compression therapy with Ace bandages overlying his current dressings.  I have added that to the dressing change order. He should avoid prolonged sitting and standing. We have discussed the importance of maintaining a healthy weight. The only other consideration would be a pneumatic compression device if he does not make good progress with elevation and compression. I also ordered formal venous reflux testing on the left. I can follow-up with his venous disease as an outpatient.  REASON FOR CONSULT:    Left leg wounds.  The consult is requested by Dr. Alfredia Ferguson.   HPI:   Raymond Gonzalez is a 51 y.o. male who was admitted yesterday with worsening wounds of his left lower extremity.  He has a history of venous stasis ulcers but developed increasing erythema, edema, and an odor to the wounds.  The wounds are also tender.   On my history, the patient developed these wounds 3-1/2 years ago.  He had an injury to his legs and the wound on the left leg did not heal.  He has had wounds off and on for 3-1/2 years.  The wounds have never  completely healed.  He has been to the wound care center but ultimately stopped going to this.  I do not get any history of claudication or rest pain. He does have a job that requires him to stand for long hours.  In addition he tells me that at night, he cannot sleep, so he spends long hours standing and working on staying last.  Thus he spends an enormous amount of time on his feet without much relief.  He denies any previous history of DVT.  He had smoked up to 2 packs/day of cigarettes but is cut back to around 3 cigarettes a day.  This is his only real risk factor for peripheral arterial disease.  He denies any history of diabetes, hypertension, hypercholesterolemia, or family history of premature cardiovascular disease.  Past Medical History:  Diagnosis Date   Asthma    Class 2 obesity 12/30/2021   Prediabetes     Family History  Problem Relation Age of Onset   Clotting disorder Mother    Deep vein thrombosis Mother    Diabetes type II Father    Depression Neg Hx     SOCIAL HISTORY: Social History   Tobacco Use   Smoking status: Every Day    Packs/day: 0.25    Types: Cigarettes   Smokeless tobacco: Never  Substance Use Topics   Alcohol use: No    No Known Allergies  Current Facility-Administered Medications  Medication Dose Route Frequency Provider Last Rate Last Admin   acetaminophen (TYLENOL) tablet 650  mg  650 mg Oral Q6H PRN Bobette Mo, MD       Or   acetaminophen (TYLENOL) suppository 650 mg  650 mg Rectal Q6H PRN Bobette Mo, MD       bisacodyl (DULCOLAX) suppository 10 mg  10 mg Rectal Daily PRN Marland Mcalpine, Omair Latif, DO       ceFEPIme (MAXIPIME) 2 g in sodium chloride 0.9 % 100 mL IVPB  2 g Intravenous Q8H Bobette Mo, MD 200 mL/hr at 12/31/21 0855 2 g at 12/31/21 0855   insulin aspart (novoLOG) injection 0-15 Units  0-15 Units Subcutaneous TID WC Bobette Mo, MD       ondansetron [facility]) tablet 4 mg  4 mg Oral Q6H PRN Bobette Mo, MD       Or   ondansetron [facility]) injection 4 mg  4 mg Intravenous Q6H PRN Bobette Mo, MD       oxyCODONE (Oxy IR/ROXICODONE) immediate release tablet 5 mg  5 mg Oral Q4H PRN Bobette Mo, MD   5 mg at 12/31/21 1610   polyethylene glycol (MIRALAX / GLYCOLAX) packet 17 g  17 g Oral BID Sheikh, Omair Latif, DO       senna-docusate (Senokot-S) tablet 1 tablet  1 tablet Oral BID Sheikh, Omair Latif, DO       silver sulfADIAZINE (SILVADENE) 1 % cream   Topical Daily Sheikh, Omair Latif, DO       vancomycin (VANCOREADY) IVPB 1500 mg/300 mL  1,500 mg Intravenous Q12H Bobette Mo, MD 150 mL/hr at 12/31/21 0939 1,500 mg at 12/31/21 9604    REVIEW OF SYSTEMS:  [X]  denotes positive finding, [ ]  denotes negative finding Cardiac  Comments:  Chest pain or chest pressure:    Shortness of breath upon exertion:    Short of breath when lying flat:    Irregular heart rhythm:        Vascular    Pain in calf, thigh, or hip brought on by ambulation:    Pain in feet at night that wakes you up from your sleep:     Blood clot in your veins:    Leg swelling:  x       Pulmonary    Oxygen at home:    Productive cough:     Wheezing:         Neurologic    Sudden weakness in arms or legs:     Sudden numbness in arms or legs:     Sudden onset of difficulty speaking or slurred speech:    Temporary loss of vision in one eye:     Problems with dizziness:         Gastrointestinal    Blood in stool:     Vomited blood:         Genitourinary    Burning when urinating:     Blood in urine:        Psychiatric    Major depression:         Hematologic    Bleeding problems:    Problems with blood clotting too easily:        Skin    Rashes or ulcers: x       Constitutional    Fever or chills:    -  PHYSICAL EXAM:   Vitals:   12/30/21 1509 12/30/21 2008 12/30/21 2223 12/31/21 0350  BP: (!) 156/94 131/78 (!) 104/56 (!) 150/83  Pulse: 87 65 75 77  Resp: 16 18 18  18   Temp: 97.8 F (36.6 C) 97.8 F (36.6 C) 98 F (36.7 C) 97.9 F (36.6 C)  TempSrc: Oral Oral Oral Oral  SpO2: 97% 98% 98% 97%  Weight:      Height:       Body mass index is 36.94 kg/m. GENERAL: The patient is a well-nourished male, in no acute distress. The vital signs are documented above. CARDIAC: There is a regular rate and rhythm.  VASCULAR: I do not detect carotid bruits. He has palpable femoral, popliteal, dorsalis pedis, and posterior tibial pulses bilaterally. He has significant bilateral lower extremity swelling and hyperpigmentation bilaterally. PULMONARY: There is good air exchange bilaterally without wheezing or rales. ABDOMEN: Soft and non-tender with normal pitched bowel sounds.  MUSCULOSKELETAL: There are no major deformities. NEUROLOGIC: No focal weakness or paresthesias are detected. SKIN: The patient has multiple full-thickness wounds of the left lower extremity.  The largest one measured 7 cm in length by 4-1/2 cm in width.       PSYCHIATRIC: The patient has a normal affect.  DATA:    ARTERIAL DOPPLER STUDY: I have independently interpreted his arterial Doppler study today.  On the right side, there is a triphasic dorsalis pedis and posterior tibial signal.  ABIs 100%.  Toe pressures 121 mmHg.  On the left side, which is the side of concern, there is a triphasic dorsalis pedis and posterior tibial signal.  ABI could not be obtained because of the dressing.  Toe pressure is 94 mmHg.  VENOUS DUPLEX: I reviewed his venous duplex scan today which shows no evidence of DVT in either lower extremity.   [facility]

## 2022-01-01 ENCOUNTER — Ambulatory Visit: Payer: No Typology Code available for payment source | Admitting: Nurse Practitioner

## 2022-01-01 ENCOUNTER — Encounter (HOSPITAL_COMMUNITY): Payer: Commercial Managed Care - HMO

## 2022-01-01 DIAGNOSIS — S81802A Unspecified open wound, left lower leg, initial encounter: Secondary | ICD-10-CM

## 2022-01-01 DIAGNOSIS — E669 Obesity, unspecified: Secondary | ICD-10-CM | POA: Diagnosis not present

## 2022-01-01 DIAGNOSIS — L03116 Cellulitis of left lower limb: Secondary | ICD-10-CM | POA: Diagnosis not present

## 2022-01-01 DIAGNOSIS — Z720 Tobacco use: Secondary | ICD-10-CM | POA: Diagnosis not present

## 2022-01-01 DIAGNOSIS — D500 Iron deficiency anemia secondary to blood loss (chronic): Secondary | ICD-10-CM | POA: Diagnosis not present

## 2022-01-01 LAB — COMPREHENSIVE METABOLIC PANEL
ALT: 14 U/L (ref 0–44)
AST: 14 U/L — ABNORMAL LOW (ref 15–41)
Albumin: 3.4 g/dL — ABNORMAL LOW (ref 3.5–5.0)
Alkaline Phosphatase: 56 U/L (ref 38–126)
Anion gap: 5 (ref 5–15)
BUN: 17 mg/dL (ref 6–20)
CO2: 28 mmol/L (ref 22–32)
Calcium: 8.9 mg/dL (ref 8.9–10.3)
Chloride: 103 mmol/L (ref 98–111)
Creatinine, Ser: 0.79 mg/dL (ref 0.61–1.24)
GFR, Estimated: >60 mL/min (ref >60)
Glucose, Bld: 125 mg/dL — ABNORMAL HIGH (ref 70–99)
Potassium: 3.9 mmol/L (ref 3.5–5.1)
Sodium: 136 mmol/L (ref 135–145)
Total Bilirubin: 0.3 mg/dL (ref 0.3–1.2)
Total Protein: 7.3 g/dL (ref 6.5–8.1)

## 2022-01-01 LAB — GLUCOSE, CAPILLARY
Glucose-Capillary: 84 mg/dL (ref 70–99)
Glucose-Capillary: 99 mg/dL (ref 70–99)
Glucose-Capillary: 135 mg/dL — ABNORMAL HIGH (ref 70–99)
Glucose-Capillary: 101 mg/dL — ABNORMAL HIGH (ref 70–99)

## 2022-01-01 LAB — CBC WITH DIFFERENTIAL/PLATELET
Abs Immature Granulocytes: 0.06 10*3/uL (ref 0.00–0.07)
Basophils Absolute: 0 10*3/uL (ref 0.0–0.1)
Basophils Relative: 1 %
Eosinophils Absolute: 0.4 10*3/uL (ref 0.0–0.5)
Eosinophils Relative: 6 %
HCT: 35.5 % — ABNORMAL LOW (ref 39.0–52.0)
Hemoglobin: 11 g/dL — ABNORMAL LOW (ref 13.0–17.0)
Immature Granulocytes: 1 %
Lymphocytes Relative: 30 %
Lymphs Abs: 1.7 10*3/uL (ref 0.7–4.0)
MCH: 25.3 pg — ABNORMAL LOW (ref 26.0–34.0)
MCHC: 31 g/dL (ref 30.0–36.0)
MCV: 81.8 fL (ref 80.0–100.0)
Monocytes Absolute: 0.4 10*3/uL (ref 0.1–1.0)
Monocytes Relative: 8 %
Neutro Abs: 3.1 10*3/uL (ref 1.7–7.7)
Neutrophils Relative %: 54 %
Platelets: 189 10*3/uL (ref 150–400)
RBC: 4.34 MIL/uL (ref 4.22–5.81)
RDW: 13.9 % (ref 11.5–15.5)
WBC: 5.6 10*3/uL (ref 4.0–10.5)
nRBC: 0 % (ref 0.0–0.2)

## 2022-01-01 LAB — PHOSPHORUS: Phosphorus: 3.3 mg/dL (ref 2.5–4.6)

## 2022-01-01 LAB — MAGNESIUM: Magnesium: 2.3 mg/dL (ref 1.7–2.4)

## 2022-01-01 MED ORDER — CEFAZOLIN SODIUM-DEXTROSE 2-4 GM/100ML-% IV SOLN
2.0000 g | Freq: Three times a day (TID) | INTRAVENOUS | Status: DC
  Administered 2022-01-01 – 2022-01-03 (×5): 2 g via INTRAVENOUS
  Filled 2022-01-01 (×6): qty 100

## 2022-01-01 NOTE — Plan of Care (Signed)
Patient is stable and awake in bed

## 2022-01-01 NOTE — Hospital Course (Signed)
51 year old man presents with chronic left lower extremity wounds, admitted for cellulitis left lower extremity.  Also reported melena.  Started on antibiotics.  Seen by vascular surgery with working diagnosis of chronic venous insufficiency and plan for outpatient follow-up.

## 2022-01-01 NOTE — Progress Notes (Signed)
Pt refused afternoon insulin

## 2022-01-01 NOTE — Progress Notes (Signed)
  Progress Note   Patient: Raymond Gonzalez [id number] DOB: 09/14/1970 DOA: 12/30/2021     2 DOS: the patient was seen and examined on 01/01/2022   Brief hospital course: 51 year old man presents with chronic left lower extremity wounds, admitted for cellulitis left lower extremity.  Also reported melena.  Started on antibiotics.  Seen by vascular surgery with working diagnosis of chronic venous insufficiency and plan for outpatient follow-up. Continue antibiotics.  Assessment and Plan: Cellulitis of left lower extremity complicated by chronic nonhealing wounds for 3 years thought secondary to chronic venous insufficiency. --Seen by vascular surgery, wound care, will continue antibiotics, continue dressing changes and reevaluate in the morning. -- Stop smoking, discussed rationale with patient.  Compression therapy, leg elevation, avoid prolonged sitting and standing, recommend weight loss, consider pneumatic compression device -- Formal venous reflux testing as an outpatient (not done as an inpatient).  Follow-up with vascular surgery as an outpatient. -- Reestablish with wound care center as an outpatient.  Chronic blood loss anemia by report with reported melena --Seems to be chronic in nature and hemoglobin is stable.  Continue PPI and follow-up with GI as an outpatient.  Consider outpatient colonoscopy.   Asthma --Stable.  Continue bronchodilators as needed.   Class 2 obesity --Current BMI 36.94 kg/m. --Weight loss advised.   Prediabetes --Hemoglobin A1c 5.8, follow-up as an outpatient   Cigarette smoker --Recommend cessation  Obesity Body mass index is 36.94 kg/m. -- Recommend weight loss.  Consider referral to obesity clinic.     Subjective:  Feels better, but leg still sore Has had wound for 3 years after work accident Right leg wound finally healed a few months ago Had been going to [facility] but stopped Still smokes <1ppd  Physical Exam: Vitals:   12/30/21 2223  12/31/21 0350 12/31/21 2142 01/01/22 0355  BP: (!) 104/56 (!) 150/83 (!) 147/91 (!) 155/88  Pulse: 75 77 73 72  Resp: 18 18 17 19   Temp: 98 F (36.7 C) 97.9 F (36.6 C) 98.4 F (36.9 C) 98.6 F (37 C)  TempSrc: Oral Oral    SpO2: 98% 97% 98% 97%  Weight:      Height:       Physical Exam Vitals reviewed.  Constitutional:      General: He is not in acute distress.    Appearance: He is not ill-appearing or toxic-appearing.  Cardiovascular:     Rate and Rhythm: Normal rate and regular rhythm.     Heart sounds: No murmur heard.    Comments: Telemetry SR Pulmonary:     Effort: Pulmonary effort is normal. No respiratory distress.     Breath sounds: No wheezing, rhonchi or rales.  Musculoskeletal:     Right lower leg: No edema.     Left lower leg: No edema.  Skin:    Comments: Left leg wrapped  Neurological:     Mental Status: He is alert.  Psychiatric:        Mood and Affect: Mood normal.        Behavior: Behavior normal.     Data Reviewed:  CBG stable CMP noted Hgb stable 11 Imaging studies noted  Family Communication: friend at bedside  Disposition: Status is: Inpatient Remains inpatient appropriate because: LLE cellulitis  Planned Discharge Destination: Home    Time spent: 35 minutes  Author: Murray Hodgkins, MD 01/01/2022 11:32 AM  For on call review [email or web address].

## 2022-01-02 DIAGNOSIS — L03116 Cellulitis of left lower limb: Secondary | ICD-10-CM | POA: Diagnosis not present

## 2022-01-02 DIAGNOSIS — S81802A Unspecified open wound, left lower leg, initial encounter: Secondary | ICD-10-CM | POA: Diagnosis not present

## 2022-01-02 DIAGNOSIS — E669 Obesity, unspecified: Secondary | ICD-10-CM | POA: Diagnosis not present

## 2022-01-02 DIAGNOSIS — R7303 Prediabetes: Secondary | ICD-10-CM | POA: Diagnosis not present

## 2022-01-02 LAB — GLUCOSE, CAPILLARY
Glucose-Capillary: 224 mg/dL — ABNORMAL HIGH (ref 70–99)
Glucose-Capillary: 84 mg/dL (ref 70–99)
Glucose-Capillary: 95 mg/dL (ref 70–99)
Glucose-Capillary: 133 mg/dL — ABNORMAL HIGH (ref 70–99)

## 2022-01-02 MED ORDER — MAGNESIUM CITRATE PO SOLN
1.0000 | Freq: Once | ORAL | Status: AC
  Administered 2022-01-02: 1 via ORAL
  Filled 2022-01-02: qty 296

## 2022-01-02 NOTE — Progress Notes (Signed)
Patient is refusing insulin, stating that he was told that "if you start insulin then your body starts to produce less of it." Patient educated on the importance of insulin as well as the importance of diet and exercise

## 2022-01-02 NOTE — Progress Notes (Signed)
  Progress Note   Patient: Raymond Gonzalez HFW:[id number] DOB: Aug 28, 1970 DOA: 12/30/2021     3 DOS: the patient was seen and examined on 01/02/2022   Brief hospital course: 51 year old man presents with chronic left lower extremity wounds, admitted for cellulitis left lower extremity.  Also reported melena.  Started on antibiotics.  Seen by vascular surgery with working diagnosis of chronic venous insufficiency and plan for outpatient follow-up.  Assessment and Plan: Cellulitis of left lower extremity complicated by chronic nonhealing wounds for 3 years thought secondary to chronic venous insufficiency. --Seen by vascular surgery, wound care.  Cellulitis appears to be resolving.  We will continue IV antibiotics today, changed to oral antibiotic therapy tomorrow. -- Stop smoking, patient committed compression therapy, leg elevation, avoid prolonged sitting and standing, recommend weight loss, consider pneumatic compression device -- Formal venous reflux testing as an outpatient (not done as an inpatient).  Follow-up with vascular surgery as an outpatient. -- Reestablish with wound care center as an outpatient.   Chronic blood loss anemia by report with reported melena --Seems to be chronic in nature and hemoglobin is stable. Continue PPI and follow-up with GI as an outpatient.  Consider outpatient colonoscopy.   Asthma --Stable.  Continue bronchodilators as needed.   Prediabetes --Hemoglobin A1c 5.8, follow-up as an outpatient.  Recommend weight loss.   Cigarette smoker --Recommend cessation; patient commited   Obesity Body mass index is 36.94 kg/m. -- Recommend weight loss.  Recommend referral to obesity clinic outpatient, especially in light of difficulty exercising. -- Recommend whole food diet, cut out sugar, grains  Constipation --continue Miralax, senna, PRN dulcolax, add Mg citrate x1     Subjective:  Feels better Still has pain in leg Eating ok  Physical Exam: Vitals:    01/01/22 1430 01/01/22 1942 01/02/22 0417 01/02/22 1402  BP: 127/73 136/78 (!) 158/96 (!) 146/76  Pulse: 77 65 80 75  Resp: 18 18 20 16   Temp:  98.3 F (36.8 C) 97.9 F (36.6 C) 98 F (36.7 C)  TempSrc:    Oral  SpO2: 98% 98% 96% 96%  Weight:      Height:       Physical Exam Vitals reviewed.  Constitutional:      General: He is not in acute distress.    Appearance: He is not ill-appearing or toxic-appearing.  Cardiovascular:     Rate and Rhythm: Normal rate and regular rhythm.     Heart sounds: No murmur heard. Pulmonary:     Effort: Pulmonary effort is normal. No respiratory distress.     Breath sounds: No wheezing, rhonchi or rales.  Skin:    Comments: LLE examined, multiple wounds noted, compared to pictures on file, erythema has resolved.  Neurological:     Mental Status: He is alert.  Psychiatric:        Mood and Affect: Mood normal.        Behavior: Behavior normal.     Data Reviewed:  CBG labile, 84-224  Family Communication: none  Disposition: Status is: Inpatient Remains inpatient appropriate because: LLE cellulitis complicated by non-healing wounds   Planned Discharge Destination: Home    Time spent: 25 minutes  Author: Murray Hodgkins, MD 01/02/2022 6:19 PM  For on call review [email or web address].

## 2022-01-02 NOTE — Plan of Care (Signed)
  Problem: Education: Goal: Knowledge of General Education information will improve Description: Including pain rating scale, medication(s)/side effects and non-pharmacologic comfort measures Outcome: Progressing   Problem: Clinical Measurements: Goal: Ability to maintain clinical measurements within normal limits will improve Outcome: Progressing   Problem: Coping: Goal: Level of anxiety will decrease Outcome: Progressing   Problem: Elimination: Goal: Will not experience complications related to bowel motility Outcome: Progressing   Problem: Pain Managment: Goal: General experience of comfort will improve Outcome: Progressing   Problem: Safety: Goal: Ability to remain free from injury will improve Outcome: Progressing   Problem: Skin Integrity: Goal: Risk for impaired skin integrity will decrease Outcome: Progressing   Problem: Tissue Perfusion: Goal: Adequacy of tissue perfusion will improve Outcome: Progressing

## 2022-01-02 NOTE — Plan of Care (Signed)
Patient is stable and awake in bed. He has c/o discomfort to the LLE due to tight wrapping of dressing. Dressing removed for Provider assessment and will be redressed after.

## 2022-01-02 NOTE — Progress Notes (Signed)
Wound cleansed and redressed, pt tolerated it well

## 2022-01-02 NOTE — Progress Notes (Signed)
VASCULAR SURGERY:  Venous reflux testing is not done in the hospital.  Therefore I have arranged for venous reflux testing of the left lower extremity when he sees me as an outpatient.  Follow-up has been arranged.  Vascular surgery will be available as needed.  Gae Gallop, MD 8:50 AM

## 2022-01-03 DIAGNOSIS — L03116 Cellulitis of left lower limb: Secondary | ICD-10-CM | POA: Diagnosis not present

## 2022-01-03 DIAGNOSIS — S81802A Unspecified open wound, left lower leg, initial encounter: Secondary | ICD-10-CM | POA: Diagnosis not present

## 2022-01-03 LAB — GLUCOSE, CAPILLARY
Glucose-Capillary: 110 mg/dL — ABNORMAL HIGH (ref 70–99)
Glucose-Capillary: 126 mg/dL — ABNORMAL HIGH (ref 70–99)

## 2022-01-03 MED ORDER — CEPHALEXIN 500 MG PO CAPS: 500.0000 mg | ORAL_CAPSULE | Freq: Four times a day (QID) | ORAL | 0 refills | Status: AC

## 2022-01-03 MED ORDER — SILVER SULFADIAZINE 1 % EX CREA: TOPICAL_CREAM | Freq: Every day | CUTANEOUS | 0 refills | Status: AC

## 2022-01-03 NOTE — Discharge Summary (Addendum)
Physician Discharge Summary   Patient: Raymond Gonzalez MRN: [id number] DOB: 1971/03/02  Admit date:     12/30/2021  Discharge date: 01/03/22  Discharge Physician: Brendia Sacks   PCP: Claiborne Rigg, NP   Recommendations at discharge:   Cellulitis of left lower extremity complicated by chronic nonhealing wounds for 3 years thought secondary to chronic venous insufficiency. -- Formal venous reflux testing as an outpatient (not done as an inpatient).  Follow-up with vascular surgery as an outpatient.   Chronic blood loss anemia by report with reported melena --Seems to be chronic in nature and hemoglobin is stable. Continue PPI and follow-up with GI as an outpatient.  Consider outpatient colonoscopy.   Cigarette smoker --Recommend cessation; patient commited   Obesity -- Recommend weight loss.  Recommend referral to obesity clinic outpatient, especially in light of difficulty exercising.  Discharge Diagnoses: Principal Problem:   Cellulitis of left lower extremity Active Problems:   Asthma   Class 2 obesity   Tobacco use   Chronic blood loss anemia   Melena   Prediabetes   Wound of left leg  Resolved Problems:   * No resolved hospital problems. *  Hospital Course: 51 year old man presented with chronic left lower extremity wounds, admitted for cellulitis left lower extremity.  Also reported melena.  Started on antibiotics.  Seen by vascular surgery with working diagnosis of chronic venous insufficiency and plan for outpatient follow-up.  Gradually improved with IV antibiotics and stable for discharge.  Individual issues as below.  Cellulitis of left lower extremity complicated by chronic nonhealing wounds for 3 years thought secondary to chronic venous insufficiency. --Seen by vascular surgery, wound care.  Cellulitis appears to be resolving.  Complete course of oral antibiotics as an outpatient. -- Stop smoking.  Compression therapy, leg elevation, avoid prolonged sitting  and standing, recommend weight loss, consider pneumatic compression device -- Formal venous reflux testing as an outpatient (not done as an inpatient).  Follow-up with vascular surgery as an outpatient. -- Reestablish with wound care center as an outpatient.  Daily soap and water cleanse, rinse, and gentle pat dry followed by application of silver sulfadiazine cream (Silvadene) topped with saline moistened gauze, dry gauze and covered with ABD pads for absorbency and comfort. The dressings are to be secured with Kerlix roll gauze applied from just below toes to just below knee and topped with ACE bandage wrapped in a similar manner.   Chronic blood loss anemia by report with reported melena --Seems to be chronic in nature and hemoglobin is stable. Continue PPI and follow-up with GI as an outpatient.  Consider outpatient colonoscopy.   Asthma --Stable.  Continue bronchodilators as needed.   Prediabetes --Hemoglobin A1c 5.8, follow-up as an outpatient.  Recommend weight loss.   Cigarette smoker --Recommend cessation; patient commited   Obesity Body mass index is 36.94 kg/m. -- Recommend weight loss.  Recommend referral to obesity clinic outpatient, especially in light of difficulty exercising. -- Recommend whole food diet, cut out sugar, grains   Constipation --continue Miralax, senna, PRN dulcolax, add Mg citrate x1   Consultants:  Vascular surgery  Procedures performed:  None   Disposition: Home Diet recommendation:  Regular diet DISCHARGE MEDICATION: Allergies as of 01/03/2022   No Known Allergies      Medication List     TAKE these medications    cephALEXin 500 MG capsule Commonly known as: KEFLEX Take 1 capsule (500 mg total) by mouth 4 (four) times daily for 10 days.   silver  sulfADIAZINE 1 % cream Commonly known as: SILVADENE Apply topically daily. Start taking on: January 04, 2022               Discharge Care Instructions  (From admission,  onward)           Start     Ordered   01/03/22 0000  Discharge wound care:       Comments: Wound care to left LE full thickness wounds:  Wash leg with soap and water, rinse and dry gently. Apply a 1/8 inch layer of silver sulfadiazine (Silvadene) to ulcers, top with saline moistened gauze, cover with dry gauze, ABD pads.  Wrap from just below toes to just below knee with Kerlix roll gauze, top with ACE bandage applied in a similar manner.  Wrapped the left leg with two 4 inch Ace bandages over the top of the dressing.  The Ace bandages should be tighter towards the ankle and less tight moving towards the knee.   01/03/22 1141            Follow-up Information     Gildardo Pounds, NP. Schedule an appointment as soon as possible for a visit in 1 week(s).   Specialty: Nurse Practitioner Contact information: [place] [address] [phone]         [name], MD. Schedule an appointment as soon as possible for a visit in 2 week(s).   Specialties: Vascular Surgery, Cardiology Contact information: [place] [address] [phone]                Feels better Left leg improving Won't smoke any more Discharge Exam: Filed Weights   12/30/21 0524  Weight: 127 kg   Physical Exam Vitals reviewed.  Constitutional:      General: He is not in acute distress.    Appearance: He is not ill-appearing or toxic-appearing.  Cardiovascular:     Rate and Rhythm: Normal rate and regular rhythm.     Heart sounds: No murmur heard. Pulmonary:     Effort: Pulmonary effort is normal. No respiratory distress.     Breath sounds: No wheezing, rhonchi or rales.  Neurological:     Mental Status: He is alert.  Psychiatric:        Mood and Affect: Mood normal.        Behavior: Behavior normal.      Condition at discharge: good  The results of significant diagnostics from this hospitalization (including imaging, microbiology, ancillary  and laboratory) are listed below for reference.   Imaging Studies: VAS [place] LOWER EXTREMITY VENOUS (DVT)  Result Date: 12/31/2021  Lower Venous DVT Study Patient Name:  DASEAN BROW  Date of Exam:   12/31/2021 Medical Rec #: [id number]      Accession #:    [id number] Date of Birth: 11/28/1970     Patient Gender: M Patient Age:   66 years Exam Location:  [facility] Procedure:      VAS [place] LOWER EXTREMITY VENOUS (DVT) Referring Phys: DAVID ORTIZ --------------------------------------------------------------------------------  Indications: Edema.  Risk Factors: None identified. Limitations: Body habitus, poor ultrasound/tissue interface, open wound and bandages. Comparison Study: No prior studies. Performing Technologist: Oliver Hum RVT  Examination Guidelines: A complete evaluation includes B-mode imaging, spectral Doppler, color Doppler, and power Doppler as needed of all accessible portions of each vessel. Bilateral testing is considered an integral part of a complete examination. Limited examinations for reoccurring indications may be performed as noted. The  reflux portion of the exam is performed with the patient in reverse Trendelenburg.  +---------+---------------+---------+-----------+----------+--------------+ RIGHT    CompressibilityPhasicitySpontaneityPropertiesThrombus Aging +---------+---------------+---------+-----------+----------+--------------+ CFV      Full           Yes      Yes                                 +---------+---------------+---------+-----------+----------+--------------+ SFJ      Full                                                        +---------+---------------+---------+-----------+----------+--------------+ FV Prox  Full                                                        +---------+---------------+---------+-----------+----------+--------------+ FV Mid   Full                                                         +---------+---------------+---------+-----------+----------+--------------+ FV DistalFull                                                        +---------+---------------+---------+-----------+----------+--------------+ PFV      Full                                                        +---------+---------------+---------+-----------+----------+--------------+ POP      Full           Yes      Yes                                 +---------+---------------+---------+-----------+----------+--------------+ PTV      Full                                                        +---------+---------------+---------+-----------+----------+--------------+ PERO     Full                                                        +---------+---------------+---------+-----------+----------+--------------+   +---------+---------------+---------+-----------+----------+-------------------+ LEFT     CompressibilityPhasicitySpontaneityPropertiesThrombus Aging      +---------+---------------+---------+-----------+----------+-------------------+ CFV      Full  Yes      Yes                                      +---------+---------------+---------+-----------+----------+-------------------+ SFJ      Full                                                             +---------+---------------+---------+-----------+----------+-------------------+ FV Prox  Full                                                             +---------+---------------+---------+-----------+----------+-------------------+ FV Mid                  Yes      Yes                                      +---------+---------------+---------+-----------+----------+-------------------+ FV Distal               Yes      Yes                                      +---------+---------------+---------+-----------+----------+-------------------+ PFV      Full                                                              +---------+---------------+---------+-----------+----------+-------------------+ POP      Full           Yes      Yes                                      +---------+---------------+---------+-----------+----------+-------------------+ PTV      Full                                                             +---------+---------------+---------+-----------+----------+-------------------+ PERO                                                  Not well visualized +---------+---------------+---------+-----------+----------+-------------------+     Summary: RIGHT: - There is no evidence of deep vein thrombosis in the lower extremity.  - No cystic structure found in the popliteal fossa.  LEFT: - There is no evidence of deep vein thrombosis in the lower extremity.  However, portions of this examination were limited- see technologist comments above.  - No cystic structure found in the popliteal fossa.  *See table(s) above for measurements and observations. Electronically signed by Waverly Ferrari MD on 12/31/2021 at 6:16:48 PM.    Final    VAS [place] ABI WITH/WO TBI  Result Date: 12/31/2021  LOWER EXTREMITY DOPPLER STUDY Patient Name:  KANNAN PROIA  Date of Exam:   12/31/2021 Medical Rec #: [id number]      Accession #:    [id number] Date of Birth: 1971/01/02     Patient Gender: M Patient Age:   74 years Exam Location:  [facility] Procedure:      VAS [place] ABI WITH/WO TBI Referring Phys: DAVID ORTIZ --------------------------------------------------------------------------------  Indications: Ulceration. High Risk Factors: None.  Limitations: Today's exam was limited due to an open wound and bandages. Comparison Study: No prior studies. Performing Technologist: Olen Cordial RVT  Examination Guidelines: A complete evaluation includes at minimum, Doppler waveform signals and systolic blood pressure reading at the level of bilateral brachial, anterior tibial, and posterior  tibial arteries, when vessel segments are accessible. Bilateral testing is considered an integral part of a complete examination. Photoelectric Plethysmograph (PPG) waveforms and toe systolic pressure readings are included as required and additional duplex testing as needed. Limited examinations for reoccurring indications may be performed as noted.  ABI Findings: +---------+------------------+-----+---------+--------+ Right    Rt Pressure (mmHg)IndexWaveform Comment  +---------+------------------+-----+---------+--------+ Brachial 130                    triphasic         +---------+------------------+-----+---------+--------+ PTA      163               1.21 triphasic         +---------+------------------+-----+---------+--------+ DP       147               1.09 triphasic         +---------+------------------+-----+---------+--------+ Great Toe121               0.90                   +---------+------------------+-----+---------+--------+ +---------+------------------+-----+-----------+-------+ Left     Lt Pressure (mmHg)IndexWaveform   Comment +---------+------------------+-----+-----------+-------+ Brachial 135                    triphasic          +---------+------------------+-----+-----------+-------+ PTA                             multiphasic        +---------+------------------+-----+-----------+-------+ DP                              multiphasic        +---------+------------------+-----+-----------+-------+ Great Toe94                0.70                    +---------+------------------+-----+-----------+-------+ +-------+-----------+-----------+------------+------------+ ABI/TBIToday's ABIToday's TBIPrevious ABIPrevious TBI +-------+-----------+-----------+------------+------------+ Right  1.21       0.9                                 +-------+-----------+-----------+------------+------------+ Left  0.7                                  +-------+-----------+-----------+------------+------------+  Summary: Right: Resting right ankle-brachial index is within normal range. The right toe-brachial index is normal. Left: The left toe-brachial index is normal. Unable to obtain ABI due to ankle ulcers. *See table(s) above for measurements and observations.  Electronically signed by Waverly Ferrari MD on 12/31/2021 at 6:11:25 PM.    Final    DG Tibia/Fibula Left  Result Date: 12/30/2021 CLINICAL DATA:  Leg pain.  Redness and swelling. EXAM: LEFT TIBIA AND FIBULA - 2 VIEW COMPARISON:  None Available. FINDINGS: Soft tissue changes in the anterior leg suggest soft tissue wound/ulceration. No underlying bony destruction or periosteal reaction to suggest osteomyelitis. No worrisome lytic or sclerotic osseous abnormality. IMPRESSION: Soft tissue wound/ulceration in the anterior lower leg. No underlying bony abnormality. Electronically Signed   By: Kennith Center M.D.   On: 12/30/2021 06:15    Microbiology: Results for orders placed or performed during the hospital encounter of 12/30/21  Blood culture (routine x 2)     Status: None (Preliminary result)   Collection Time: 12/30/21  6:09 AM   Specimen: BLOOD RIGHT FOREARM  Result Value Ref Range Status   Specimen Description   Final    BLOOD RIGHT FOREARM Performed at [facility], [address] [place], [name] [address]    Special Requests   Final    BOTTLES DRAWN AEROBIC AND ANAEROBIC Blood Culture adequate volume Performed at [facility], [address] [place], [name] [address]    Culture   Final    NO GROWTH 4 DAYS Performed at [facility], [address], [place], [name] [address]    Report Status PENDING  Incomplete  Blood culture (routine x 2)     Status: None (Preliminary result)   Collection Time: 12/30/21  6:09 AM   Specimen: BLOOD RIGHT HAND  Result Value Ref Range Status   Specimen Description   Final    BLOOD RIGHT  HAND Performed at [facility], [address] [place], [name] [address]    Special Requests   Final    BOTTLES DRAWN AEROBIC AND ANAEROBIC Blood Culture results may not be optimal due to an inadequate volume of blood received in culture bottles Performed at [facility], [address] [place], [name] [address]    Culture   Final    NO GROWTH 4 DAYS Performed at [facility], [address] [place], [name] [address]    Report Status PENDING  Incomplete    Labs: CBC: Recent Labs  Lab 12/30/21 0609 12/30/21 1949 12/31/21 0514 01/01/22 0503  WBC 6.0  --  5.9 5.6  NEUTROABS 3.0  --   --  3.1  HGB 11.2* 11.0* 10.7* 11.0*  HCT 34.6* 35.6* 34.5* 35.5*  MCV 81.2  --  83.1 81.8  PLT 223  --  180 189   Basic Metabolic Panel: Recent Labs  Lab 12/30/21 0609 12/31/21 0514 01/01/22 0503  NA 138 139 136  K 4.2 4.4 3.9  CL 104 104 103  CO2 28 30 28   GLUCOSE 125* 99 125*  BUN 18 19 17   CREATININE 0.72 0.63 0.79  CALCIUM 8.9 8.8* 8.9  MG  --   --  2.3  PHOS  --   --  3.3   Liver Function Tests: Recent Labs  Lab 12/31/21 0514 01/01/22 0503  AST 12* 14*  ALT 12 14  ALKPHOS 52 56  BILITOT 0.3 0.3  PROT 6.7 7.3  ALBUMIN 3.0* 3.4*   CBG: Recent Labs  Lab 01/02/22 1149 01/02/22 1645 01/02/22 2229 01/03/22 0741 01/03/22 1151  GLUCAP 84 95 133* 110* 126*    Discharge time spent: less than 30 minutes.  Signed: Brendia Sacks, MD [facility] 01/03/2022

## 2022-01-03 NOTE — Progress Notes (Signed)
Initial Nutrition Assessment  DOCUMENTATION CODES:   Obesity unspecified  INTERVENTION:  -Continue carb modified diet as tolerated -Reinforce diet education as needed -Outpt follow up with RD  NUTRITION DIAGNOSIS:  Food and nutrition related knowledge deficit related to other (see comment) (prediabetes and weight loss) as evidenced by other (comment) (lack of prior education).  GOAL:  Patient will meet greater than or equal to 90% of their needs, Other (Comment) (teach back diet education)  MONITOR:  PO intake, Other (Comment) (nutrition questions)  REASON FOR ASSESSMENT:  Consult Other (Comment) (interested in diet with less processed foods)  ASSESSMENT:  Pt is a 51yo M with PMH of asthma, prediabetes and obesity who presents with worsening of chronic LLE venous stasis ulcers which now have more erythema, surrounding edema, foul-smelling discharge and are tender to palpation.  Visited pt at bedside this AM to discuss nutrition at home. Pt reports he is trying to lose weight and was talking to the doctor about a less processed diet yesterday. Reviewed usual intake: B-eggs, life cereal and 2% milk L-skipped D-chicken and vegetables or a few bowls of cereal with 2% milk Snacks: occasionally snack cakes Beverages: coke, cherry coke and juice   Pt asked about fad diets, or intermittent fasting. Discussed that a recent study compared the paleo diet, mediterranean diet and intermittent fasting and found that the mediterranean diet was more effective at lowering A1c and BP than the other two options. It also promoted weight loss and was more sustainable. Provided Mediterranean Diet handout and discussed what this diet consists of. Explained that it promotes using more fresh foods, and less processed foods. Encouraged pt to do most of his grocery shopping on the perimeter of the grocery store and using the middle aisles to complete meals. Discussed each food group and healthiest options  within each group: fresh fruits, fresh vegetables, whole grains, lean protein and low fat dairy products. Reviewed portion control and the importance of consuming complete meals rather than a large portion of just 1 food. Encouraged the inclusion of 3 food groups in each meal to ensure he is getting adequate protein, fiber and other nutrients. Explained that protein and fiber help you feel fuller longer which can help with weight loss. Reviewed usual diet and made suggestions for complete meals. Encouraged cutting out sodas and juices and replacing it with water or seltzer water. If flavor is needed in beverages try using lemon or lime. Discussed snack options. Pt asked appropriate questions and demonstrates understanding. Pt is in preparation stage of change and anticipate good compliance. Pt is interested in outpatient follow up with RD. Referral sent via EMR.  Medications reviewed and include: novolog, miralax, senokot-s, prn oxycodone  Labs reviewed: BG: 84-224   NUTRITION - FOCUSED PHYSICAL EXAM:  Flowsheet Row Most Recent Value  Orbital Region No depletion  Upper Arm Region No depletion  Thoracic and Lumbar Region No depletion  Buccal Region No depletion  Temple Region No depletion  Clavicle Bone Region No depletion  Clavicle and Acromion Bone Region No depletion  Scapular Bone Region No depletion  Dorsal Hand No depletion  Patellar Region No depletion  Anterior Thigh Region No depletion  Posterior Calf Region No depletion  Hair Reviewed  Eyes Reviewed  Mouth Reviewed  Skin Reviewed  Nails Reviewed       Diet Order:   Diet Order             Diet heart healthy/carb modified Room service appropriate? Yes; Fluid consistency: Thin  Diet effective now                   EDUCATION NEEDS:   Education needs have been addressed  Skin:  Skin Assessment: Skin Integrity Issues: Skin Integrity Issues:: Incisions Incisions: tibia and pretibia  Last BM:  10/12  Height:   Ht Readings from Last 1 Encounters:  12/30/21 6\' 1"  (1.854 m)    Weight:  Wt Readings from Last 1 Encounters:  12/30/21 127 kg    Ideal Body Weight:  83.6 kg, current body weight is 152% IBW  BMI:  Body mass index is 36.94 kg/m.  Estimated Nutritional Needs:   Kcal:  1700-2100kcal  Protein:  100-125g  Fluid:  1700-2153mL  Candise Bowens, MS, RD, LDN, CNSC See AMi[facility] for contact information

## 2022-01-03 NOTE — Plan of Care (Signed)
Patient is stable, IV line removed. AVS reviewed and he acknowledged understanding

## 2022-01-04 LAB — CULTURE, BLOOD (ROUTINE X 2)
Culture: NO GROWTH
Culture: NO GROWTH
Special Requests: ADEQUATE

## 2022-01-06 ENCOUNTER — Telehealth: Payer: Self-pay

## 2022-01-06 NOTE — Telephone Encounter (Signed)
Transition Care Management Unsuccessful Follow-up Telephone Call  Date of discharge and from where:  01/03/2022, [facility]  Attempts:  1st Attempt  Reason for unsuccessful TCM follow-up call:  Unable to reach patient- I called [phone] and the recording stated that the number is not in service.  Need to discuss scheduling a hospital follow up appointment.  Geryl Rankins, NP is listed as PCP but he has not seen her in almost 2 years.

## 2022-01-07 ENCOUNTER — Telehealth: Payer: Self-pay

## 2022-01-07 ENCOUNTER — Telehealth: Payer: Self-pay | Admitting: Vascular Surgery

## 2022-01-07 NOTE — Telephone Encounter (Signed)
Transition Care Management Unsuccessful Follow-up Telephone Call  Date of discharge and from where:   01/03/2022, [facility]  Attempts:  2nd Attempt  Reason for unsuccessful TCM follow-up call:  Unable to reach patient I called [phone] and the recording stated that the number is not in service.   Need to discuss scheduling a hospital follow up appointment.  Geryl Rankins, NP is listed as PCP but he has not seen her in almost 2 years.

## 2022-01-07 NOTE — Telephone Encounter (Signed)
-----   Message from [name], MD sent at 01/01/2022  8:47 AM EDT ----- Regarding: Office visit This patient will need an office visit to see me on a vein day in 3 to 4 weeks.  He will need formal venous reflux testing on the left when he comes in as they cannot do that in the hospital.  Thank you.  Raymond Gonzalez

## 2022-01-08 ENCOUNTER — Telehealth: Payer: Self-pay

## 2022-01-08 NOTE — Telephone Encounter (Signed)
Transition Care Management Unsuccessful Follow-up Telephone Call  Date of discharge and from where:  01/03/2022, [facility]  Attempts:  3rd Attempt  Reason for unsuccessful TCM follow-up call:  Unable to reach patient  I called [phone] and the recording stated that the number is not in service.   Letter sent to patient requesting he call [facility] to schedule a hospital follow up appointment as we have not been able to reach him.   Geryl Rankins, NP is listed as PCP but he has not seen her in almost 2 years.

## 2022-01-22 ENCOUNTER — Ambulatory Visit: Payer: Self-pay

## 2022-01-22 NOTE — Telephone Encounter (Signed)
  Chief Complaint: increased wounds drainage, pain, redness Symptoms: pain unrelieved by OTC meds, redness noted Frequency: ongoing issue  Pertinent Negatives: Patient denies rash, weakness Disposition: [] ED /[] Urgent Care (no appt availability in office) / [x] Appointment(In office/virtual)/ []  [facility]/ [] Home Care/ [] Refused Recommended Disposition /[] [facility]/ []  Follow-up with PCP Additional Notes: hospital f/u appt made for tomorrow am- Would wound center and or home health help pt. Asking for pain med. Reason for Disposition  [1] Looks infected (spreading redness, red streak, pus) AND [2] no fever  Answer Assessment - Initial Assessment Questions 1. SYMPTOM or QUESTION: "What is your reason for calling today?" or "How can I best help you?" (e.g., bleeding, redness, drainage, pain)     Wanting abx- draining wounds to left ankle- 2. WOUND APPEARANCE: "What does the wound look like?" "Is there new or spreading redness?" If Yes, ask: "What is the size of the red area?" (Inches, centimeters, or compare to size of a coin).  "Has the drainage changed or increased?"  "Is there a new odor?"       Multiple wounds (5 in total) that are present and draining moderate amount of serosanguinous drainage. Pt stated looks red and painful 3. ONSET: "When did the problem begin?"     Ch onic issue- was hospitalized for wound infection for 5 days.  4. LOCATION: "Where is the wound(s)?" (e.g., , ankle, lower left leg)     Left ankle  5. BLEEDING: "Are you having a problem with bleeding?"  (e.g., amount, timing)     no 6. PAIN: "Is there any pain?" If Yes, ask: "How bad is the pain?" (Scale 1-10; or mild, moderate, severe)     5 out of 10 7. FEVER: "Do you have a fever?" If Yes, ask: "What is your temperature, how was it measured, and when did it start?"     no 8. OTHER SYMPTOMS: "Do you have any other symptoms?" (e.g., chills, rash elsewhere, new weakness)     Pain, increased  drainage, concerned needs more abx 9. TREATMENT: "How have you been treating the wound?"     Changing dressings and applying silver sulfalene to wounds changing dressings 4 times a day 10. NEGATIVE PRESSURE WOUND THERAPY  (NPWT): "What concerns do you have about your negative pressure dressing?"  "When was your last dressing change?"  "Are you getting a device alert or alarm?"  "What have you done to try to fix the problem?"          N/a 11. PREGNANCY: "Is there any chance you are pregnant?" "When was your last menstrual period?       N/a  Protocols used: Wound - Chronic and Negative Pressure Wound Therapy-A-AH

## 2022-01-23 ENCOUNTER — Inpatient Hospital Stay: Payer: Commercial Managed Care - HMO | Admitting: Internal Medicine

## 2022-01-24 ENCOUNTER — Ambulatory Visit: Payer: Commercial Managed Care - HMO | Attending: Internal Medicine | Admitting: Internal Medicine

## 2022-01-24 ENCOUNTER — Encounter: Payer: Self-pay | Admitting: Internal Medicine

## 2022-01-24 VITALS — BP 120/80 | HR 105 | Temp 98.0°F | Ht 73.0 in | Wt 307.6 lb

## 2022-01-24 DIAGNOSIS — K921 Melena: Secondary | ICD-10-CM

## 2022-01-24 DIAGNOSIS — Z09 Encounter for follow-up examination after completed treatment for conditions other than malignant neoplasm: Secondary | ICD-10-CM

## 2022-01-24 DIAGNOSIS — L97922 Non-pressure chronic ulcer of unspecified part of left lower leg with fat layer exposed: Secondary | ICD-10-CM

## 2022-01-24 DIAGNOSIS — R7303 Prediabetes: Secondary | ICD-10-CM

## 2022-01-24 DIAGNOSIS — D649 Anemia, unspecified: Secondary | ICD-10-CM

## 2022-01-24 MED ORDER — AMOXICILLIN-POT CLAVULANATE 500-125 MG PO TABS: 1.0000 | ORAL_TABLET | Freq: Two times a day (BID) | ORAL | 0 refills | Status: AC

## 2022-01-24 MED ORDER — PREDNISONE 20 MG PO TABS: ORAL_TABLET | ORAL | 0 refills | Status: AC

## 2022-01-24 NOTE — Progress Notes (Signed)
Patient ID: Raymond Gonzalez, male    DOB: 1970-11-28  MRN: [id number]  TD:SKAJ f/u  Subjective: Raymond Gonzalez is a 51 y.o. male who presents for hosp f/u.  PCP is Bertram Denver. His concerns today include:  Patient with history of asthma, chronic venous stasis dermatitis, HTN, tobacco dependence, obesity, prediabetes  Patient hospitalized 10/9-13/2023 with cellulitis LLE complicated by chronic nonhealing wound x3 years thought to be secondary to chronic venous insufficiency.  Patient was seen by vascular surgery and wound care.  Given course of oral antibiotics to complete as an outpatient.  Advised to stop smoking.  Advised compression therapy and leg elevation.  Follow-up with vascular surgery as an outpatient recommended. Patient had also complained of melena.  Noted to have chronic anemia with hemoglobin stable between 10.7-11.2. He was also found to have prediabetes with A1c of 5.8.   Today: Has 2 pills left of Keflex.  Leg no better; thinks it is worse.  I asked about if he has had any fever, he thinks temp goes up after he takes abx.   -He has been doing dressing changes 4 times a day.  He uses warm water with 2 tablespoon of bleach in the water.  He washes the leg, dabs it dry then apply Silvadene cream.  He has been using male feminine napkins instead of gauze because the gauze clings to the skin and is very painful when peeling it off.  He has a lot of drainage which is why he has been changing 4 times a day.  The ulcers are painful.  States that it started as 1 also over the upper shin about a year ago and progressed into several ulcers.  He has been keeping his leg elevated.  He was not given a follow-up appointment for wound care clinic upon hospital discharge Has appt with vascular surgeon, Edilia Bo on 02/05/2022  Anemia:  still having dark brown stools and constipation.  PreDM/obesity:  A1c 5.8 Pt walks daily for 45 mins for exercise Working on improving eating habits.  Saw  nutritionist in hosp and given information.  Trying to find healthy ways to eat.   Patient Active Problem List   Diagnosis Date Noted   Wound of left leg 01/01/2022   Cellulitis of left lower extremity 12/30/2021   Asthma 12/30/2021   Class 2 obesity 12/30/2021   Tobacco use 12/30/2021   Chronic blood loss anemia 12/30/2021   Melena 12/30/2021   Prediabetes 12/30/2021     Current Outpatient Medications on File Prior to Visit  Medication Sig Dispense Refill   cephALEXin (KEFLEX) 500 MG capsule Take 500 mg by mouth 4 (four) times daily.     silver sulfADIAZINE (SILVADENE) 1 % cream Apply topically daily. 50 g 0   No current facility-administered medications on file prior to visit.    No Known Allergies  Social History   Socioeconomic History   Marital status: Legally Separated    Spouse name: Not on file   Number of children: Not on file   Years of education: Not on file   Highest education level: Not on file  Occupational History   Not on file  Tobacco Use   Smoking status: Every Day    Packs/day: 0.25    Types: Cigarettes   Smokeless tobacco: Never  Vaping Use   Vaping Use: Never used  Substance and Sexual Activity   Alcohol use: No   Drug use: Yes    Types: Marijuana    Comment: pain  pills   Sexual activity: Not Currently  Other Topics Concern   Not on file  Social History Narrative   Not on file   Social Determinants of Health   Financial Resource Strain: Not on file  Food Insecurity: No Food Insecurity (12/30/2021)   Hunger Vital Sign    Worried About Running Out of Food in the Last Year: Never true    Ran Out of Food in the Last Year: Never true  Transportation Needs: Unmet Transportation Needs (12/30/2021)   PRAPARE - Administrator, Civil Service (Medical): Yes    Lack of Transportation (Non-Medical): Yes  Physical Activity: Not on file  Stress: Not on file  Social Connections: Not on file  Intimate Partner Violence: Not At Risk  (12/30/2021)   Humiliation, Afraid, Rape, and Kick questionnaire    Fear of Current or Ex-Partner: No    Emotionally Abused: No    Physically Abused: No    Sexually Abused: No    Family History  Problem Relation Age of Onset   Clotting disorder Mother    Deep vein thrombosis Mother    Diabetes type II Father    Depression Neg Hx     Past Surgical History:  Procedure Laterality Date   NO PAST SURGERIES      ROS: Review of Systems Negative except as stated above  PHYSICAL EXAM: BP 120/80   Pulse (!) 105   Temp 98 F (36.7 C) (Temporal)   Ht 6\' 1"  (1.854 m)   Wt (!) 307 lb 9.6 oz (139.5 kg)   SpO2 97%   BMI 40.58 kg/m   Wt Readings from Last 3 Encounters:  01/24/22 (!) 307 lb 9.6 oz (139.5 kg)  12/30/21 280 lb (127 kg)  07/13/21 270 lb (122.5 kg)    Physical Exam   General appearance - alert, well appearing, and in no distress Mental status - normal mood, behavior, speech, dress, motor activity, and thought processes Chest - clear to auscultation, no wheezes, rales or rhonchi, symmetric air entry Heart - normal rate, regular rhythm, normal S1, S2, no murmurs, rubs, clicks or gallops Skin -left leg: Patient with several ulcers with well demarcated edges and exudative base.  Leg is malodorous.  Please see pictures below.  Leg from below the knee down is erythematous.  Dorsalis pedis pulses 3+.           Latest Ref Rng & Units 01/01/2022    5:03 AM 12/31/2021    5:14 AM 12/30/2021    6:09 AM  CMP  Glucose 70 - 99 mg/dL 03/01/2022  99  619   BUN 6 - 20 mg/dL 17  19  18    Creatinine 0.61 - 1.24 mg/dL 509   3.26   Sodium 135 - 145 mmol/L 136  139  138   Potassium 3.5 - 5.1 mmol/L 3.9  4.4  4.2   Chloride 98 - 111 mmol/L 103  104  104   CO2 22 - 32 mmol/L 28  30  28    Calcium 8.9 - 10.3 mg/dL 8.9  8.8  8.9   Total Protein 6.5 - 8.1 g/dL 7.3  6.7    Total Bilirubin 0.3 - 1.2 mg/dL 0.3  0.3    Alkaline Phos 38 - 126 U/L 56  52    AST 15 - 41 U/L 14  12     ALT 0 - 44 U/L 14  12     Lipid Panel  No results found  for: "CHOL", "TRIG", "HDL", "CHOLHDL", "VLDL", "LDLCALC", "LDLDIRECT"  CBC    Component Value Date/Time   WBC 5.6 01/01/2022 0503   RBC 4.34 01/01/2022 0503   HGB 11.0 (L) 01/01/2022 0503   HCT 35.5 (L) 01/01/2022 0503   PLT 189 01/01/2022 0503   MCV 81.8 01/01/2022 0503   MCH 25.3 (L) 01/01/2022 0503   MCHC 31.0 01/01/2022 0503   RDW 13.9 01/01/2022 0503   LYMPHSABS 1.7 01/01/2022 0503   MONOABS 0.4 01/01/2022 0503   EOSABS 0.4 01/01/2022 0503   BASOSABS 0.0 01/01/2022 0503    ASSESSMENT AND PLAN:  1. Hospital discharge follow-up   2. Skin ulcer of left lower leg with fat layer exposed ([place]) Advised patient to be seen in the emergency room again if he feels that this is worse compared to what it was when he was discharged from the hospital. -He does have venous stasis but I also wonder whether he has pyoderma gangrenosum.  Worth giving a trial of prednisone for a few weeks to see if there is a positive response.  Message sent to our referral coordinator to try to get him in with wound care center and dermatology as soon as possible.  Keep upcoming appointment with vein and vascular.  Advised to continue dressing changes.  Prescription given for nonstick dressing. - AMB referral to wound care center - Ambulatory referral to Dermatology - amoxicillin-clavulanate (AUGMENTIN) 500-125 MG tablet; Take 1 tablet by mouth 2 (two) times daily.  Dispense: 14 tablet; Refill: 0 - For home use only DME Other see comment  3. Prediabetes Patient advised to eliminate sugary drinks from the diet, cut back on portion sizes especially of white carbohydrates, eat more white lean meat like chicken [place] and seafood instead of beef or pork and incorporate fresh fruits and vegetables into the diet daily.   4. Normocytic anemia - Iron, TIBC and Ferritin Panel - Ambulatory referral to Gastroenterology - CBC With Diff/Platelet  5.  Melena - Ambulatory referral to Gastroenterology - CBC With Diff/Platelet    Patient was given the opportunity to ask questions.  Patient verbalized understanding of the plan and was able to repeat key elements of the plan.   This documentation was completed using Radio producer.  Any transcriptional errors are unintentional.  Orders Placed This Encounter  Procedures   For home use only DME Other see comment   Iron, TIBC and Ferritin Panel   CBC With Diff/Platelet   Ambulatory referral to Gastroenterology   AMB referral to wound care center   Ambulatory referral to Dermatology     Requested Prescriptions   Signed Prescriptions Disp Refills   predniSONE (DELTASONE) 20 MG tablet 23 tablet 0    Sig: 2 tabs PO daily x 5 days then 1.5 tabs daily x 5 days then 1 tab PO x 5 days   amoxicillin-clavulanate (AUGMENTIN) 500-125 MG tablet 14 tablet 0    Sig: Take 1 tablet by mouth 2 (two) times daily.    Return in about 2 weeks (around 02/07/2022) for Give 2 wks f/u with PCP Geryl Rankins.  Karle Plumber, MD, FACP

## 2022-01-24 NOTE — Patient Instructions (Signed)
Continue dressing changes at least twice a day. I have referred you to dermatology and to wound care clinic. We will try you on prednisone for possible pyoderma.  If this is pyoderma, it will respond dramatically well to the prednisone.  If this is not improving with prednisone and the added antibiotics that I prescribed today, you should be seen in the emergency room again.

## 2022-01-25 LAB — CBC WITH DIFF/PLATELET
Basophils Absolute: 0 10*3/uL (ref 0.0–0.2)
Basos: 1 %
EOS (ABSOLUTE): 0.4 10*3/uL (ref 0.0–0.4)
Eos: 6 %
Hematocrit: 36 % — ABNORMAL LOW (ref 37.5–51.0)
Hemoglobin: 11.6 g/dL — ABNORMAL LOW (ref 13.0–17.7)
Immature Grans (Abs): 0 10*3/uL (ref 0.0–0.1)
Immature Granulocytes: 0 %
Lymphocytes Absolute: 1.8 10*3/uL (ref 0.7–3.1)
Lymphs: 32 %
MCH: 25.3 pg — ABNORMAL LOW (ref 26.6–33.0)
MCHC: 32.2 g/dL (ref 31.5–35.7)
MCV: 79 fL (ref 79–97)
Monocytes Absolute: 0.6 10*3/uL (ref 0.1–0.9)
Monocytes: 10 %
Neutrophils Absolute: 2.9 10*3/uL (ref 1.4–7.0)
Neutrophils: 51 %
Platelets: 264 10*3/uL (ref 150–450)
RBC: 4.58 x10E6/uL (ref 4.14–5.80)
RDW: 14.1 % (ref 11.6–15.4)
WBC: 5.6 10*3/uL (ref 3.4–10.8)

## 2022-01-25 LAB — IRON,TIBC AND FERRITIN PANEL
Ferritin: 97 ng/mL (ref 30–400)
Iron Saturation: 7 % — CL (ref 15–55)
Iron: 18 ug/dL — ABNORMAL LOW (ref 38–169)
Total Iron Binding Capacity: 270 ug/dL (ref 250–450)
UIBC: 252 ug/dL (ref 111–343)

## 2022-01-29 ENCOUNTER — Telehealth: Payer: Self-pay | Admitting: Internal Medicine

## 2022-01-29 ENCOUNTER — Other Ambulatory Visit: Payer: Self-pay | Admitting: *Deleted

## 2022-01-29 DIAGNOSIS — L03116 Cellulitis of left lower limb: Secondary | ICD-10-CM

## 2022-01-29 NOTE — Telephone Encounter (Signed)
-----   Message from Dionne Bucy sent at 01/29/2022  8:36 AM EST ----- Regarding: RE: Derm and Wound CARE ASAP Gm    Wound Care  contacted patient  and the # is not on service   General 01/28/2022  4:40 PM White, [place] - - Note: Called patient to schedule appt phone was not in service  .  ----- Message ----- From: Marcine Matar, MD Sent: 01/24/2022   1:03 PM EST To: Dionne Bucy Subject: Derm and Wound CARE ASAP                       Please try to get him in with dermatology and wound care as soon as possible.

## 2022-02-05 ENCOUNTER — Ambulatory Visit (HOSPITAL_COMMUNITY): Payer: Commercial Managed Care - HMO | Attending: Vascular Surgery

## 2022-02-05 ENCOUNTER — Ambulatory Visit: Payer: Commercial Managed Care - HMO | Admitting: Vascular Surgery

## 2022-02-12 ENCOUNTER — Ambulatory Visit: Payer: Commercial Managed Care - HMO | Admitting: Nurse Practitioner

## 2022-03-18 ENCOUNTER — Ambulatory Visit: Payer: Commercial Managed Care - HMO | Admitting: Nurse Practitioner

## 2022-08-03 ENCOUNTER — Other Ambulatory Visit: Payer: Self-pay

## 2022-08-03 ENCOUNTER — Emergency Department (HOSPITAL_BASED_OUTPATIENT_CLINIC_OR_DEPARTMENT_OTHER)
Admission: EM | Admit: 2022-08-03 | Discharge: 2022-08-03 | Disposition: A | Discharge status: Home / Self Care | Payer: Medicaid Other | Attending: Emergency Medicine | Admitting: Emergency Medicine

## 2022-08-03 ENCOUNTER — Encounter (HOSPITAL_BASED_OUTPATIENT_CLINIC_OR_DEPARTMENT_OTHER): Payer: Self-pay | Admitting: Emergency Medicine

## 2022-08-03 ENCOUNTER — Emergency Department (HOSPITAL_BASED_OUTPATIENT_CLINIC_OR_DEPARTMENT_OTHER): Payer: Medicaid Other

## 2022-08-03 DIAGNOSIS — R109 Unspecified abdominal pain: Secondary | ICD-10-CM | POA: Diagnosis present

## 2022-08-03 DIAGNOSIS — N132 Hydronephrosis with renal and ureteral calculous obstruction: Secondary | ICD-10-CM | POA: Diagnosis not present

## 2022-08-03 DIAGNOSIS — N200 Calculus of kidney: Secondary | ICD-10-CM

## 2022-08-03 LAB — COMPREHENSIVE METABOLIC PANEL
ALT: 16 U/L (ref 0–44)
AST: 15 U/L (ref 15–41)
Albumin: 4.3 g/dL (ref 3.5–5.0)
Alkaline Phosphatase: 67 U/L (ref 38–126)
Anion gap: 10 (ref 5–15)
BUN: 17 mg/dL (ref 6–20)
CO2: 23 mmol/L (ref 22–32)
Calcium: 9.1 mg/dL (ref 8.9–10.3)
Chloride: 105 mmol/L (ref 98–111)
Creatinine, Ser: 0.88 mg/dL (ref 0.61–1.24)
GFR, Estimated: >60 mL/min (ref >60)
Glucose, Bld: 150 mg/dL — ABNORMAL HIGH (ref 70–99)
Potassium: 3.7 mmol/L (ref 3.5–5.1)
Sodium: 138 mmol/L (ref 135–145)
Total Bilirubin: 0.3 mg/dL (ref 0.3–1.2)
Total Protein: 7.5 g/dL (ref 6.5–8.1)

## 2022-08-03 LAB — CBC WITH DIFFERENTIAL/PLATELET
Abs Immature Granulocytes: 0.01 10*3/uL (ref 0.00–0.07)
Basophils Absolute: 0 10*3/uL (ref 0.0–0.1)
Basophils Relative: 1 %
Eosinophils Absolute: 0.2 10*3/uL (ref 0.0–0.5)
Eosinophils Relative: 4 %
HCT: 44.7 % (ref 39.0–52.0)
Hemoglobin: 14.5 g/dL (ref 13.0–17.0)
Immature Granulocytes: 0 %
Lymphocytes Relative: 35 %
Lymphs Abs: 2.3 10*3/uL (ref 0.7–4.0)
MCH: 26.2 pg (ref 26.0–34.0)
MCHC: 32.4 g/dL (ref 30.0–36.0)
MCV: 80.7 fL (ref 80.0–100.0)
Monocytes Absolute: 0.4 10*3/uL (ref 0.1–1.0)
Monocytes Relative: 7 %
Neutro Abs: 3.5 10*3/uL (ref 1.7–7.7)
Neutrophils Relative %: 53 %
Platelets: 212 10*3/uL (ref 150–400)
RBC: 5.54 MIL/uL (ref 4.22–5.81)
RDW: 14.6 % (ref 11.5–15.5)
WBC: 6.5 10*3/uL (ref 4.0–10.5)
nRBC: 0 % (ref 0.0–0.2)

## 2022-08-03 LAB — URINALYSIS, W/ REFLEX TO CULTURE (INFECTION SUSPECTED)
Bilirubin Urine: NEGATIVE
Glucose, UA: NEGATIVE mg/dL
Ketones, ur: NEGATIVE mg/dL
Leukocytes,Ua: NEGATIVE
Nitrite: NEGATIVE
Protein, ur: 30 mg/dL — AB
RBC / HPF: >50 RBC/hpf (ref 0–5)
Specific Gravity, Urine: >=1.03 (ref 1.005–1.030)
pH: 6.5 (ref 5.0–8.0)

## 2022-08-03 LAB — LIPASE, BLOOD: Lipase: 28 U/L (ref 11–51)

## 2022-08-03 MED ORDER — KETOROLAC TROMETHAMINE 30 MG/ML IJ SOLN
30.0000 mg | Freq: Once | INTRAMUSCULAR | Status: AC
  Administered 2022-08-03: 30 mg via INTRAVENOUS
  Filled 2022-08-03: qty 1

## 2022-08-03 MED ORDER — SODIUM CHLORIDE 0.9 % IV BOLUS
500.0000 mL | Freq: Once | INTRAVENOUS | Status: AC
  Administered 2022-08-03: 500 mL via INTRAVENOUS

## 2022-08-03 MED ORDER — LIDOCAINE IN D5W 4-5 MG/ML-% IV SOLN
180.0000 mg | Freq: Once | INTRAVENOUS | Status: AC
  Administered 2022-08-03: 180 mg via INTRAVENOUS
  Filled 2022-08-03: qty 500

## 2022-08-03 MED ORDER — HYDROMORPHONE HCL 1 MG/ML IJ SOLN
1.0000 mg | Freq: Once | INTRAMUSCULAR | Status: AC
  Administered 2022-08-03: 1 mg via INTRAVENOUS
  Filled 2022-08-03: qty 1

## 2022-08-03 MED ORDER — TAMSULOSIN HCL 0.4 MG PO CAPS: 0.4000 mg | ORAL_CAPSULE | Freq: Every day | ORAL | 0 refills | Status: AC

## 2022-08-03 MED ORDER — DICYCLOMINE HCL 20 MG PO TABS: 20.0000 mg | ORAL_TABLET | Freq: Two times a day (BID) | ORAL | 0 refills | Status: DC

## 2022-08-03 MED ORDER — ONDANSETRON 4 MG PO TBDP: 4.0000 mg | ORAL_TABLET | Freq: Three times a day (TID) | ORAL | 0 refills | Status: AC | PRN

## 2022-08-03 MED ORDER — OXYBUTYNIN CHLORIDE ER 10 MG PO TB24: 10.0000 mg | ORAL_TABLET | Freq: Every day | ORAL | 0 refills | Status: AC

## 2022-08-03 MED ORDER — OXYCODONE HCL 5 MG PO TABS: 5.0000 mg | ORAL_TABLET | ORAL | 0 refills | Status: AC | PRN

## 2022-08-03 MED ORDER — DIAZEPAM 5 MG/ML IJ SOLN
2.5000 mg | Freq: Once | INTRAMUSCULAR | Status: AC
  Administered 2022-08-03: 2.5 mg via INTRAVENOUS
  Filled 2022-08-03: qty 2

## 2022-08-03 MED ORDER — SODIUM CHLORIDE 0.9 % IV SOLN: 1.5000 mg/kg | Freq: Once | INTRAVENOUS | Status: DC

## 2022-08-03 MED ORDER — ONDANSETRON HCL 4 MG/2ML IJ SOLN
4.0000 mg | Freq: Once | INTRAMUSCULAR | Status: AC
  Administered 2022-08-03: 4 mg via INTRAVENOUS
  Filled 2022-08-03: qty 2

## 2022-08-03 MED ORDER — OXYCODONE HCL 5 MG PO TABS: 5.0000 mg | ORAL_TABLET | Freq: Four times a day (QID) | ORAL | 0 refills | Status: DC | PRN

## 2022-08-03 NOTE — ED Provider Triage Note (Signed)
Emergency Medicine Provider Triage Evaluation Note  Raymond Gonzalez , a 52 y.o. male  was evaluated in triage.  Pt complains of flank pain starting suddenly at 4AM. He has dark urine as well. Notes a prior history of smaller kidney stones but nothing this severe. No prior urology intervention required.   Review of Systems  Positive: Flank pain and dark urine Negative: Fever  Physical Exam  BP (!) 215/111 (BP Location: Right Arm)   Pulse 74   Temp (!) 97.4 F (36.3 C) (Oral)   Resp 20   SpO2 99%  Gen:   Awake, Appears uncomfortable with frequent shifting in bed.  Resp:  Normal effort  MSK:   Moves extremities without difficulty  Other:  Non-tender abdomen.   Medical Decision Making  Medically screening exam initiated at 6:39 AM.  Appropriate orders placed.  Raymond Gonzalez was informed that the remainder of the evaluation will be completed by another provider, this initial triage assessment does not replace that evaluation, and the importance of remaining in the ED until their evaluation is complete.    Maia Plan, MD 08/03/22 [phone]

## 2022-08-03 NOTE — Discharge Instructions (Addendum)
Please return if you develop a fever greater than 100.4 or have worsening pain despite pain medication at home.  I have prescribed you a narcotic pain medicine called Roxicodone for pain.  This medication is sedating so you do not mix with alcohol or drugs or dangerous activities.  I have written you for nausea medicine called Zofran.  Have also written you for medicine called Flomax to help expel this stone.  Follow-up with urology.  Recommend also 1000 mg of Tylenol every 6 hours as needed for pain.  Recommend 800 mg ibuprofen every 8 hours as needed for pain.

## 2022-08-03 NOTE — ED Triage Notes (Signed)
Patient here with lower left flank pain.  Patient states that he thinks it is kidney stones.  He has had them before, but not this bad.  He states that he has nausea with the pain, not vomiting at this time.

## 2022-08-03 NOTE — ED Provider Notes (Signed)
[place] [facility] Provider Note   CSN: [id number] Arrival date & time: 08/03/22  7829     History  Chief Complaint  Patient presents with   Flank Pain    Raymond Gonzalez is a 52 y.o. male.  Patient here with flank pain started a few hours ago.  History of kidney stones.  Reviewed some dark urine.  Denies any fever, chills.  No trauma history.  Denies any chest pain or shortness of breath.  No nausea or vomiting.  Denies any testicular pain or pain with urination.  The history is provided by the patient.       Home Medications Prior to Admission medications   Medication Sig Start Date End Date Taking? Authorizing Provider  ondansetron (ZOFRAN-ODT) 4 MG disintegrating tablet Take 1 tablet (4 mg total) by mouth every 8 (eight) hours as needed for nausea or vomiting. 08/03/22  Yes Ersel Wadleigh, DO  oxybutynin (DITROPAN XL) 10 MG 24 hr tablet Take 1 tablet (10 mg total) by mouth at bedtime for 3 days. 08/03/22 08/06/22 Yes Hairo Garraway, DO  tamsulosin (FLOMAX) 0.4 MG CAPS capsule Take 1 capsule (0.4 mg total) by mouth daily for 7 days. 08/03/22 08/10/22 Yes Thaddus Mcdowell, DO  amoxicillin-clavulanate (AUGMENTIN) 500-125 MG tablet Take 1 tablet by mouth 2 (two) times daily. 01/24/22   Marcine Matar, MD  cephALEXin (KEFLEX) 500 MG capsule Take 500 mg by mouth 4 (four) times daily.    [provider]  oxyCODONE (ROXICODONE) 5 MG immediate release tablet Take 1 tablet (5 mg total) by mouth every 4 (four) hours as needed for up to 20 doses. 08/03/22   Shubham Thackston, DO  predniSONE (DELTASONE) 20 MG tablet 2 tabs PO daily x 5 days then 1.5 tabs daily x 5 days then 1 tab PO x 5 days 01/24/22   Marcine Matar, MD  silver sulfADIAZINE (SILVADENE) 1 % cream Apply topically daily. 01/04/22   Standley Brooking, MD      Allergies    Patient has no known allergies.    Review of Systems   Review of Systems  Physical Exam Updated Vital  Signs BP (!) 197/109   Pulse 72   Temp 98.8 F (37.1 C)   Resp 18   SpO2 99%  Physical Exam Vitals and nursing note reviewed.  Constitutional:      General: He is not in acute distress.    Appearance: He is well-developed.  HENT:     Head: Normocephalic and atraumatic.  Eyes:     Conjunctiva/sclera: Conjunctivae normal.  Cardiovascular:     Rate and Rhythm: Normal rate and regular rhythm.     Heart sounds: No murmur heard. Pulmonary:     Effort: Pulmonary effort is normal. No respiratory distress.     Breath sounds: Normal breath sounds.  Abdominal:     Palpations: Abdomen is soft.     Tenderness: There is no abdominal tenderness.  Musculoskeletal:        General: No swelling.     Cervical back: Neck supple.  Skin:    General: Skin is warm and dry.     Capillary Refill: Capillary refill takes less than 2 seconds.  Neurological:     Mental Status: He is alert.  Psychiatric:        Mood and Affect: Mood normal.     ED Results / Procedures / Treatments   Labs (all labs ordered are listed, but only abnormal results  are displayed) Labs Reviewed  COMPREHENSIVE METABOLIC PANEL - Abnormal; Notable for the following components:      Result Value   Glucose, Bld 150 (*)    All other components within normal limits  URINALYSIS, W/ REFLEX TO CULTURE (INFECTION SUSPECTED) - Abnormal; Notable for the following components:   Color, Urine AMBER (*)    APPearance CLOUDY (*)    Hgb urine dipstick LARGE (*)    Protein, ur 30 (*)    Bacteria, UA MANY (*)    All other components within normal limits  URINE CULTURE  LIPASE, BLOOD  CBC WITH DIFFERENTIAL/PLATELET    EKG None  Radiology CT Renal Stone Study  Result Date: 08/03/2022 CLINICAL DATA:  52 year old male with history of abdominal and flank pain. EXAM: CT ABDOMEN AND PELVIS WITHOUT CONTRAST TECHNIQUE: Multidetector CT imaging of the abdomen and pelvis was performed following the standard protocol without IV contrast.  RADIATION DOSE REDUCTION: This exam was performed according to the departmental dose-optimization program which includes automated exposure control, adjustment of the mA and/or kV according to patient size and/or use of iterative reconstruction technique. COMPARISON:  CT of the abdomen and pelvis 03/04/2018 FINDINGS: Lower chest: Unremarkable. Hepatobiliary: Diffuse low attenuation throughout the visualized hepatic parenchyma, indicative of a background of hepatic steatosis. No definite suspicious cystic or solid hepatic lesions are confidently identified on today's noncontrast CT examination. Unenhanced appearance of the gallbladder is unremarkable. Pancreas: No definite pancreatic mass or peripancreatic fluid collections or inflammatory changes are noted on today's noncontrast CT examination. Spleen: Unremarkable. Adrenals/Urinary Tract: In the middle third of the left ureter (coronal image 54 of series 5) there is a 4 mm calculus which is associated with mild-to-moderate proximal left hydroureteronephrosis indicating obstruction. 1-2 mm nonobstructive calculus also noted in the upper pole collecting system of the left kidney. No additional calculi are noted in the right renal collecting system, along the course of the right ureter, or within the lumen of the urinary bladder. No right-sided hydroureteronephrosis. Unenhanced appearance of the right kidney and bilateral adrenal glands is normal. Urinary bladder is unremarkable in appearance. Stomach/Bowel: Unenhanced appearance of the stomach is normal. No pathologic dilatation of small bowel or colon. Normal appendix. Vascular/Lymphatic: Atherosclerotic calcifications are noted in the abdominal aorta and pelvic vasculature. No lymphadenopathy noted in the abdomen or pelvis. Reproductive: Prostate gland and seminal vesicles are unremarkable in appearance. Other: No significant volume of ascites.  No pneumoperitoneum. Musculoskeletal: There are no aggressive  appearing lytic or blastic lesions noted in the visualized portions of the skeleton. IMPRESSION: 1. 4 mm obstructive calculus in the middle third of the left ureter with mild-to-moderate proximal left hydroureteronephrosis. 2. 1-2 mm nonobstructive calculus in the upper pole collecting system of the left kidney. 3. Hepatic steatosis. 4. Aortic atherosclerosis. Electronically Signed   By: Trudie Reed M.D.   On: 08/03/2022 07:07    Procedures Procedures    Medications Ordered in ED Medications  ketorolac (TORADOL) 30 MG/ML injection 30 mg (30 mg Intravenous Given 08/03/22 0649)  ondansetron (ZOFRAN) injection 4 mg (4 mg Intravenous Given 08/03/22 0648)  HYDROmorphone (DILAUDID) injection 1 mg (1 mg Intravenous Given 08/03/22 0728)  sodium chloride 0.9 % bolus 500 mL (0 mLs Intravenous Stopped 08/03/22 0831)  lidocaine (cardiac) 2000 mg in dextrose 5% 500 mL (4mg /mL) -- USE 45ml FOR BOLUS (0 mg Intravenous Stopped 08/03/22 0832)  diazepam (VALIUM) injection 2.5 mg (2.5 mg Intravenous Given 08/03/22 0850)    ED Course/ Medical Decision Making/ A&P  Medical Decision Making Risk Prescription drug management.   Malaky Kitchens is here with left-sided flank pain.  Patient hypertensive but otherwise normal vitals.  He is very uncomfortable on exam.  History of kidney stones.  Differential diagnosis likely kidney stone or muscular process/may be infectious process.  CT scan obtained shows 4 mm obstructive kidney stone in the middle third of the left ureter.  Lab work collected shows no significant anemia or electrolyte abnormality or kidney injury or leukocytosis.  No urine infection.  He has no fever.  Patient given Toradol, Zofran, fluid bolus and Dilaudid.  Will reevaluate.  Patient still having significant discomfort after some Toradol and Dilaudid.  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  Will try some IV lidocaine.  Does not appear to have  any contraindications for this.  Patient feeling much better after lidocaine, Valium and pain medication.  Pain mostly resolved at this time.  He understands pain medications and return precautions.  Discharged in good condition.  This chart was dictated using voice recognition software.  Despite best efforts to proofread,  errors can occur which can change the documentation meaning.         Final Clinical Impression(s) / ED Diagnoses Final diagnoses:  Kidney stone    Rx / DC Orders ED Discharge Orders          Ordered    oxyCODONE (ROXICODONE) 5 MG immediate release tablet  Every 6 hours PRN,   Status:  Discontinued        08/03/22 0730    ondansetron (ZOFRAN-ODT) 4 MG disintegrating tablet  Every 8 hours PRN        08/03/22 0730    tamsulosin (FLOMAX) 0.4 MG CAPS capsule  Daily        08/03/22 0730    oxyCODONE (ROXICODONE) 5 MG immediate release tablet  Every 4 hours PRN        08/03/22 0756    dicyclomine (BENTYL) 20 MG tablet  2 times daily,   Status:  Discontinued        08/03/22 0810    oxybutynin (DITROPAN XL) 10 MG 24 hr tablet  Daily at bedtime        08/03/22 0917              Virgina Norfolk, DO 08/03/22 [phone]

## 2022-08-03 NOTE — ED Notes (Signed)
Patient transported to CT 

## 2022-08-04 LAB — URINE CULTURE: Culture: NO GROWTH

## 2022-08-06 ENCOUNTER — Encounter (HOSPITAL_BASED_OUTPATIENT_CLINIC_OR_DEPARTMENT_OTHER): Payer: Self-pay

## 2022-08-06 ENCOUNTER — Other Ambulatory Visit: Payer: Self-pay

## 2022-08-06 ENCOUNTER — Emergency Department (HOSPITAL_BASED_OUTPATIENT_CLINIC_OR_DEPARTMENT_OTHER): Payer: Medicaid Other

## 2022-08-06 ENCOUNTER — Emergency Department (HOSPITAL_BASED_OUTPATIENT_CLINIC_OR_DEPARTMENT_OTHER)
Admission: EM | Admit: 2022-08-06 | Discharge: 2022-08-06 | Disposition: A | Discharge status: Home / Self Care | Payer: Medicaid Other | Attending: Emergency Medicine | Admitting: Emergency Medicine

## 2022-08-06 DIAGNOSIS — R21 Rash and other nonspecific skin eruption: Secondary | ICD-10-CM | POA: Diagnosis not present

## 2022-08-06 DIAGNOSIS — R109 Unspecified abdominal pain: Secondary | ICD-10-CM | POA: Diagnosis present

## 2022-08-06 DIAGNOSIS — N200 Calculus of kidney: Secondary | ICD-10-CM

## 2022-08-06 LAB — CBC WITH DIFFERENTIAL/PLATELET
Abs Immature Granulocytes: 0.01 10*3/uL (ref 0.00–0.07)
Basophils Absolute: 0 10*3/uL (ref 0.0–0.1)
Basophils Relative: 1 %
Eosinophils Absolute: 0.3 10*3/uL (ref 0.0–0.5)
Eosinophils Relative: 4 %
HCT: 41 % (ref 39.0–52.0)
Hemoglobin: 13.6 g/dL (ref 13.0–17.0)
Immature Granulocytes: 0 %
Lymphocytes Relative: 33 %
Lymphs Abs: 2.7 10*3/uL (ref 0.7–4.0)
MCH: 26.7 pg (ref 26.0–34.0)
MCHC: 33.2 g/dL (ref 30.0–36.0)
MCV: 80.6 fL (ref 80.0–100.0)
Monocytes Absolute: 0.6 10*3/uL (ref 0.1–1.0)
Monocytes Relative: 7 %
Neutro Abs: 4.5 10*3/uL (ref 1.7–7.7)
Neutrophils Relative %: 55 %
Platelets: 202 10*3/uL (ref 150–400)
RBC: 5.09 MIL/uL (ref 4.22–5.81)
RDW: 14.7 % (ref 11.5–15.5)
WBC: 8.1 10*3/uL (ref 4.0–10.5)
nRBC: 0 % (ref 0.0–0.2)

## 2022-08-06 LAB — BASIC METABOLIC PANEL
Anion gap: 7 (ref 5–15)
BUN: 19 mg/dL (ref 6–20)
CO2: 26 mmol/L (ref 22–32)
Calcium: 9.3 mg/dL (ref 8.9–10.3)
Chloride: 106 mmol/L (ref 98–111)
Creatinine, Ser: 1.15 mg/dL (ref 0.61–1.24)
GFR, Estimated: >60 mL/min (ref >60)
Glucose, Bld: 149 mg/dL — ABNORMAL HIGH (ref 70–99)
Potassium: 3.7 mmol/L (ref 3.5–5.1)
Sodium: 139 mmol/L (ref 135–145)

## 2022-08-06 LAB — URINALYSIS, ROUTINE W REFLEX MICROSCOPIC
Glucose, UA: NEGATIVE mg/dL
Ketones, ur: NEGATIVE mg/dL
Leukocytes,Ua: NEGATIVE
Nitrite: NEGATIVE
Protein, ur: NEGATIVE mg/dL
Specific Gravity, Urine: >=1.03 (ref 1.005–1.030)
pH: 5.5 (ref 5.0–8.0)

## 2022-08-06 LAB — URINALYSIS, MICROSCOPIC (REFLEX): RBC / HPF: >50 RBC/hpf (ref 0–5)

## 2022-08-06 MED ORDER — VALACYCLOVIR HCL 500 MG PO TABS
1000.0000 mg | ORAL_TABLET | Freq: Once | ORAL | Status: AC
  Administered 2022-08-06: 1000 mg via ORAL
  Filled 2022-08-06: qty 2

## 2022-08-06 MED ORDER — CEFADROXIL 500 MG PO CAPS: 500.0000 mg | ORAL_CAPSULE | Freq: Once | ORAL | Status: DC

## 2022-08-06 MED ORDER — VALACYCLOVIR HCL 1 G PO TABS: 1000.0000 mg | ORAL_TABLET | Freq: Three times a day (TID) | ORAL | 0 refills | Status: AC

## 2022-08-06 MED ORDER — LACTATED RINGERS IV BOLUS
1000.0000 mL | Freq: Once | INTRAVENOUS | Status: AC
  Administered 2022-08-06: 1000 mL via INTRAVENOUS

## 2022-08-06 MED ORDER — CEFADROXIL 500 MG PO CAPS: 500.0000 mg | ORAL_CAPSULE | Freq: Two times a day (BID) | ORAL | 0 refills | Status: AC

## 2022-08-06 MED ORDER — CEFADROXIL 500 MG PO CAPS: 500.0000 mg | ORAL_CAPSULE | Freq: Two times a day (BID) | ORAL | 0 refills | Status: DC

## 2022-08-06 MED ORDER — KETOROLAC TROMETHAMINE 15 MG/ML IJ SOLN
15.0000 mg | Freq: Once | INTRAMUSCULAR | Status: AC
  Administered 2022-08-06: 15 mg via INTRAVENOUS
  Filled 2022-08-06: qty 1

## 2022-08-06 NOTE — ED Triage Notes (Signed)
Pt reports he was seen a few days ago for kidney stones and he is now having urinary retention. Has not been able to use the bathroom for 6 hours. Pt reports yesterday he was able to go some, but not today. Pt worried about infection. Pt also states he got bit by centipede in LT rib cage and wants it checked out.

## 2022-08-06 NOTE — Discharge Instructions (Signed)
You have been seen in the Emergency Department (ED)  today for left-sided flank pain.  Your rash is concerning for possible shingles or cellulitis which is a bacterial skin infection.  Take the [facility] as prescribed to cover for bacterial infection as well as Valtrex for coverage for shingles.  Your CT scan still shows evidence of a kidney stone on the left side which is at the junction between your ureter and the bladder.  It is almost out.  As we have discussed, please drink plenty of fluids and use a urinary strainer to attempt to capture the stone if possible. If you are able to catch your stone, please keep it in the provided container and bring it to your urologist appointment. Please make a follow up appointment with Urology, ideally within the week, by calling the number below.   Please take your prescribed Flomax daily. This medication will help you pass the stone. Be aware that this medicine can sometimes lower your blood pressure, so be sure to go slow when you are getting up from a seated or lying position to standing as your lower blood pressure make make you feel lightheaded or even cause you to pass out if you get up too fast.  Please see your doctor or urologist (we have made a referral) as soon as possible, ideally within one week as stones may take 1-3 weeks to pass and you may require additional care or medications.  Return to the Emergency Department (ED)  or call your doctor if you have any worsening pain, fever, painful urination, are unable to urinate, or develop other symptoms that concern you.  You may take prescription pain medication as needed but ONLY as prescribed and ONLY for pain that is not relieved by Motrin (ibuprofen) or Tylenol.  Take  your narcotic recently (your prescribed pain medication) as prescribed. Do not drink alcohol, drive or participate in any other potentially dangerous activities while taking this medication as it may make you sleepy. Do not take this  medication with any other sedating medications, either prescription or over-the-counter. If you were prescribed Percocet or Vicodin, do not take these with acetaminophen (Tylenol) as it is already contained within these medications.  This medication is an opiate (or narcotic) pain medication and can be habit forming.  Use it as little as possible to achieve adequate pain control.  Do not use or use it with extreme caution if you have a history of opiate abuse or dependence.  This medication is intended for your use only - do not give any to anyone else and keep it in a secure place where nobody else, especially children, have access to it.  Opiate medications may cause constipation. Ensure you have an adequate amount (25 grams/day) of fiber in your diet and drink plenty of water. You may also wish to take an over the counter stool softener such as Colace once or twice daily according to package instructions while you are taking this medication.

## 2022-08-06 NOTE — ED Provider Notes (Signed)
EMERGENCY DEPARTMENT AT [facility] Provider Note   CSN: [id number] Arrival date & time: 08/06/22  0003     History  Chief Complaint  Patient presents with   Urinary Retention    Raymond Gonzalez is a 52 y.o. male.  With PMH of nephrolithiasis, prediabetes, obesity recently diagnosed kidney stone who presents with ongoing left flank pain as well as new painful rash.  Patient recently seen and diagnosed with left-sided kidney stone.  He has history of kidney stones and has been peeing often but peeing little and feels like he may not be fully emptying his bladder or may have a urinary tract infection.  He has had no fevers or vomiting.  He has not seen the stone pass.  He has been drinking plenty of water but feels like the stone has not passed yet.  His pain is controlled with the oxycodone and Tylenol and ibuprofen.  However he is also complaining of new rash of his left anterior chest wall.  He developed vesicular lesions which have now crusted slightly over and developed redness and small scabs.  He thinks it was related to a centipede biting him that he found in his shirt.  He was taking some antibiotics he found at his house without improvement.  He has never had shingles before.  HPI     Home Medications Prior to Admission medications   Medication Sig Start Date End Date Taking? Authorizing Provider  valACYclovir (VALTREX) 1000 MG tablet Take 1 tablet (1,000 mg total) by mouth 3 (three) times daily. 08/06/22  Yes Mardene Sayer, MD  amoxicillin-clavulanate (AUGMENTIN) 500-125 MG tablet Take 1 tablet by mouth 2 (two) times daily. 01/24/22   Marcine Matar, MD  cefadroxil (DURICEF) 500 MG capsule Take 1 capsule (500 mg total) by mouth 2 (two) times daily for 7 days. 08/06/22 08/13/22  Mardene Sayer, MD  cephALEXin (KEFLEX) 500 MG capsule Take 500 mg by mouth 4 (four) times daily.    [provider]  ondansetron (ZOFRAN-ODT) 4 MG disintegrating  tablet Take 1 tablet (4 mg total) by mouth every 8 (eight) hours as needed for nausea or vomiting. 08/03/22   Curatolo, Adam, DO  oxybutynin (DITROPAN XL) 10 MG 24 hr tablet Take 1 tablet (10 mg total) by mouth at bedtime for 3 days. 08/03/22 08/06/22  Curatolo, Adam, DO  oxyCODONE (ROXICODONE) 5 MG immediate release tablet Take 1 tablet (5 mg total) by mouth every 4 (four) hours as needed for up to 20 doses. 08/03/22   Curatolo, Adam, DO  predniSONE (DELTASONE) 20 MG tablet 2 tabs PO daily x 5 days then 1.5 tabs daily x 5 days then 1 tab PO x 5 days 01/24/22   Marcine Matar, MD  silver sulfADIAZINE (SILVADENE) 1 % cream Apply topically daily. 01/04/22   Standley Brooking, MD  tamsulosin (FLOMAX) 0.4 MG CAPS capsule Take 1 capsule (0.4 mg total) by mouth daily for 7 days. 08/03/22 08/10/22  Virgina Norfolk, DO      Allergies    Patient has no known allergies.    Review of Systems   Review of Systems  Physical Exam Updated Vital Signs BP (!) 129/94   Pulse 76   Temp 98 F (36.7 C)   Resp 18   Ht 6\' 1"  (1.854 m)   Wt 127 kg   SpO2 95%   BMI 36.94 kg/m  Physical Exam Constitutional: Alert and oriented. Well appearing and in no distress. Eyes:  Conjunctivae are normal. ENT      Head: Normocephalic and atraumatic. Cardiovascular: Regular rate and rhythm Respiratory: Normal respiratory effort. Breath sounds are normal. Gastrointestinal: Soft and nontender.  Left-sided CVA tenderness. Musculoskeletal: Normal range of motion in all extremities. Neurologic: Normal speech and language.  Moving all 4 extremities equally.  Sensation grossly intact.  No gross focal neurologic deficits are appreciated. Skin: Skin is warm, dry.  There is a tender erythematous small clustered region of eschared/scabbed papular lesions of the left anterior chest wall.  Not crossing midline.  No pustules.  No drainage.  No fluctuance. Psychiatric: Mood and affect are normal. Speech and behavior are normal.  ED  Results / Procedures / Treatments   Labs (all labs ordered are listed, but only abnormal results are displayed) Labs Reviewed  URINALYSIS, ROUTINE W REFLEX MICROSCOPIC - Abnormal; Notable for the following components:      Result Value   APPearance CLOUDY (*)    Hgb urine dipstick LARGE (*)    Bilirubin Urine SMALL (*)    All other components within normal limits  URINALYSIS, MICROSCOPIC (REFLEX) - Abnormal; Notable for the following components:   Bacteria, UA RARE (*)    All other components within normal limits  BASIC METABOLIC PANEL - Abnormal; Notable for the following components:   Glucose, Bld 149 (*)    All other components within normal limits  CBC WITH DIFFERENTIAL/PLATELET    EKG None  Radiology CT Renal Stone Study  Result Date: 08/06/2022 CLINICAL DATA:  Urinary retention. EXAM: CT ABDOMEN AND PELVIS WITHOUT CONTRAST TECHNIQUE: Multidetector CT imaging of the abdomen and pelvis was performed following the standard protocol without IV contrast. RADIATION DOSE REDUCTION: This exam was performed according to the departmental dose-optimization program which includes automated exposure control, adjustment of the mA and/or kV according to patient size and/or use of iterative reconstruction technique. COMPARISON:  Aug 03, 2022 FINDINGS: Lower chest: No acute abnormality. Hepatobiliary: No focal liver abnormality is seen. No gallstones, gallbladder wall thickening, or biliary dilatation. Pancreas: Unremarkable. No pancreatic ductal dilatation or surrounding inflammatory changes. Spleen: Normal in size without focal abnormality. Adrenals/Urinary Tract: Adrenal glands are unremarkable. Kidneys are normal in size, without focal lesions. A 2 mm obstructing renal calculus is seen at the left UVJ, with mild left-sided hydroureter and mild left periureteral inflammatory fat stranding. Urinary bladder is poorly distended and subsequently limited in evaluation. Stomach/Bowel: Stomach is within  normal limits. Appendix appears normal. No evidence of bowel wall thickening, distention, or inflammatory changes. Vascular/Lymphatic: Aortic atherosclerosis. No enlarged abdominal or pelvic lymph nodes. Reproductive: Mild calcification is seen within a normal appearing prostate gland. Other: A 3.1 cm x 2.6 cm fat containing left inguinal hernia is noted. No abdominopelvic ascites. Musculoskeletal: No acute or significant osseous findings. IMPRESSION: 1. 2 mm obstructing renal calculus at the left UVJ. 2. Small fat containing left inguinal hernia. 3. Aortic atherosclerosis. Aortic Atherosclerosis (ICD10-I70.0). Electronically Signed   By: Aram Candela M.D.   On: 08/06/2022 01:31    Procedures Procedures    Medications Ordered in ED Medications  cefadroxil (DURICEF) capsule 500 mg (has no administration in time range)  valACYclovir (VALTREX) tablet 1,000 mg (has no administration in time range)  ketorolac (TORADOL) 15 MG/ML injection 15 mg (15 mg Intravenous Given 08/06/22 0129)  lactated ringers bolus 1,000 mL (1,000 mLs Intravenous New Bag/Given 08/06/22 0131)    ED Course/ Medical Decision Making/ A&P  Medical Decision Making Raymond Gonzalez is a 52 y.o. male.  With PMH of nephrolithiasis recently diagnosed kidney stone who presents with ongoing left flank pain as well as new painful rash.  Patient recently seen and diagnosed with left-sided kidney stone.   Patient's pain is likely multifactorial in nature.  Suspect left flank pain and urinary symptoms due to ongoing renal stone.  Personally reviewed patient's CT renal stone scan and agree with read of 2 mm left-sided left UVJ stone.  He also has associated mild hydroureter.  However, patient's pain is under control on exam with Toradol and fluids.  He is tolerating p.o.  Creatinine is 1.15 within normal limits.  UA reviewed by me, rare bacteria but no WBCs no nitrate no leukocyte esterase doubt superimposed  infection.  Patient to follow-up with urology as planned.  He has prescribed Flomax and oxycodone from recent visit.  He also has tender clustered papular eschared lesions of the left anterior chest wall that is not crossing midline.  Suspect possible shingles or superimposed cellulitis.  Will cover with both cefadroxil and Valtrex.  Discussed continued pain control.  Will follow-up with PCP for this finding.  Return precautions discussed.  Patient agreement with plan for discharge.  He will follow-up with urology as planned for renal stone.  PCP for rash suspected shingles and superimposed cellulitis.  Amount and/or Complexity of Data Reviewed Labs: ordered. Radiology: ordered.  Risk Prescription drug management.      Final Clinical Impression(s) / ED Diagnoses Final diagnoses:  Rash  Kidney stone    Rx / DC Orders ED Discharge Orders          Ordered    cefadroxil (DURICEF) 500 MG capsule  2 times daily,   Status:  Discontinued        08/06/22 0238    valACYclovir (VALTREX) 1000 MG tablet  3 times daily        08/06/22 0238    cefadroxil (DURICEF) 500 MG capsule  2 times daily        08/06/22 0248              Mardene Sayer, MD 08/06/22 0300
